# Patient Record
Sex: Male | Born: 1970 | Race: Black or African American | Hispanic: No | Marital: Married | Smoking: Never smoker
Health system: Southern US, Community
[De-identification: ages and names within clinical notes are randomized; demographics above are authoritative.]

## PROBLEM LIST (undated history)

## (undated) DIAGNOSIS — G4730 Sleep apnea, unspecified: Secondary | ICD-10-CM

## (undated) DIAGNOSIS — E785 Hyperlipidemia, unspecified: Secondary | ICD-10-CM

## (undated) DIAGNOSIS — E119 Type 2 diabetes mellitus without complications: Secondary | ICD-10-CM

## (undated) DIAGNOSIS — I10 Essential (primary) hypertension: Secondary | ICD-10-CM

## (undated) HISTORY — PX: WISDOM TOOTH EXTRACTION: SHX21

## (undated) HISTORY — DX: Hyperlipidemia, unspecified: E78.5

## (undated) HISTORY — DX: Type 2 diabetes mellitus without complications: E11.9

## (undated) HISTORY — DX: Essential (primary) hypertension: I10

---

## 2004-04-02 ENCOUNTER — Emergency Department (HOSPITAL_COMMUNITY): Admission: EM | Admit: 2004-04-02 | Discharge: 2004-04-02 | Payer: Self-pay | Admitting: Emergency Medicine

## 2004-06-11 ENCOUNTER — Emergency Department (HOSPITAL_COMMUNITY): Admission: EM | Admit: 2004-06-11 | Discharge: 2004-06-11 | Payer: Self-pay | Admitting: *Deleted

## 2015-03-11 ENCOUNTER — Emergency Department (HOSPITAL_COMMUNITY)
Admission: EM | Admit: 2015-03-11 | Discharge: 2015-03-11 | Disposition: A | Discharge status: Home / Self Care | Payer: BLUE CROSS/BLUE SHIELD | Attending: Emergency Medicine | Admitting: Emergency Medicine

## 2015-03-11 ENCOUNTER — Emergency Department (HOSPITAL_COMMUNITY): Payer: BLUE CROSS/BLUE SHIELD

## 2015-03-11 ENCOUNTER — Encounter (HOSPITAL_COMMUNITY): Payer: Self-pay | Admitting: Emergency Medicine

## 2015-03-11 DIAGNOSIS — R200 Anesthesia of skin: Secondary | ICD-10-CM | POA: Insufficient documentation

## 2015-03-11 DIAGNOSIS — N200 Calculus of kidney: Secondary | ICD-10-CM | POA: Insufficient documentation

## 2015-03-11 DIAGNOSIS — R1012 Left upper quadrant pain: Secondary | ICD-10-CM | POA: Diagnosis present

## 2015-03-11 DIAGNOSIS — R109 Unspecified abdominal pain: Secondary | ICD-10-CM

## 2015-03-11 DIAGNOSIS — Z880 Allergy status to penicillin: Secondary | ICD-10-CM | POA: Insufficient documentation

## 2015-03-11 LAB — URINALYSIS, ROUTINE W REFLEX MICROSCOPIC
Bilirubin Urine: NEGATIVE
Glucose, UA: 100 mg/dL — AB
Ketones, ur: 15 mg/dL — AB
Leukocytes, UA: NEGATIVE
Nitrite: NEGATIVE
Protein, ur: NEGATIVE mg/dL
Specific Gravity, Urine: 1.02 (ref 1.005–1.030)
Urobilinogen, UA: 0.2 mg/dL (ref 0.0–1.0)
pH: 6 (ref 5.0–8.0)

## 2015-03-11 LAB — CBC WITH DIFFERENTIAL/PLATELET
Basophils Absolute: 0 10*3/uL (ref 0.0–0.1)
Basophils Relative: 0 % (ref 0–1)
Eosinophils Absolute: 0.1 10*3/uL (ref 0.0–0.7)
Eosinophils Relative: 1 % (ref 0–5)
HCT: 42.6 % (ref 39.0–52.0)
Hemoglobin: 14.7 g/dL (ref 13.0–17.0)
Lymphocytes Relative: 11 % — ABNORMAL LOW (ref 12–46)
Lymphs Abs: 1.3 10*3/uL (ref 0.7–4.0)
MCH: 31.1 pg (ref 26.0–34.0)
MCHC: 34.5 g/dL (ref 30.0–36.0)
MCV: 90.1 fL (ref 78.0–100.0)
Monocytes Absolute: 0.5 10*3/uL (ref 0.1–1.0)
Monocytes Relative: 4 % (ref 3–12)
Neutro Abs: 10.3 10*3/uL — ABNORMAL HIGH (ref 1.7–7.7)
Neutrophils Relative %: 84 % — ABNORMAL HIGH (ref 43–77)
Platelets: 258 10*3/uL (ref 150–400)
RBC: 4.73 MIL/uL (ref 4.22–5.81)
RDW: 14.3 % (ref 11.5–15.5)
WBC: 12.1 10*3/uL — ABNORMAL HIGH (ref 4.0–10.5)

## 2015-03-11 LAB — COMPREHENSIVE METABOLIC PANEL
ALT: 37 U/L (ref 17–63)
AST: 34 U/L (ref 15–41)
Albumin: 3.8 g/dL (ref 3.5–5.0)
Alkaline Phosphatase: 98 U/L (ref 38–126)
Anion gap: 11 (ref 5–15)
BUN: 8 mg/dL (ref 6–20)
CO2: 23 mmol/L (ref 22–32)
Calcium: 9.4 mg/dL (ref 8.9–10.3)
Chloride: 106 mmol/L (ref 101–111)
Creatinine, Ser: 1.36 mg/dL — ABNORMAL HIGH (ref 0.61–1.24)
GFR calc Af Amer: >60 mL/min (ref >60)
GFR calc non Af Amer: >60 mL/min (ref >60)
Glucose, Bld: 179 mg/dL — ABNORMAL HIGH (ref 65–99)
Potassium: 4.2 mmol/L (ref 3.5–5.1)
Sodium: 140 mmol/L (ref 135–145)
Total Bilirubin: 0.9 mg/dL (ref 0.3–1.2)
Total Protein: 7.3 g/dL (ref 6.5–8.1)

## 2015-03-11 LAB — URINE MICROSCOPIC-ADD ON

## 2015-03-11 LAB — LIPASE, BLOOD: Lipase: 24 U/L (ref 22–51)

## 2015-03-11 MED ORDER — KETOROLAC TROMETHAMINE 30 MG/ML IJ SOLN
30.0000 mg | Freq: Once | INTRAMUSCULAR | Status: AC
  Administered 2015-03-11: 30 mg via INTRAVENOUS
  Filled 2015-03-11: qty 1

## 2015-03-11 MED ORDER — HYDROCODONE-ACETAMINOPHEN 5-325 MG PO TABS: 1.0000 | ORAL_TABLET | Freq: Four times a day (QID) | ORAL | Status: DC | PRN

## 2015-03-11 MED ORDER — HYDROMORPHONE HCL 1 MG/ML IJ SOLN
1.0000 mg | Freq: Once | INTRAMUSCULAR | Status: DC
  Filled 2015-03-11: qty 1

## 2015-03-11 MED ORDER — SODIUM CHLORIDE 0.9 % IV BOLUS (SEPSIS)
1000.0000 mL | Freq: Once | INTRAVENOUS | Status: AC
  Administered 2015-03-11: 1000 mL via INTRAVENOUS

## 2015-03-11 MED ORDER — ONDANSETRON HCL 4 MG/2ML IJ SOLN
4.0000 mg | Freq: Once | INTRAMUSCULAR | Status: AC
  Administered 2015-03-11: 4 mg via INTRAVENOUS
  Filled 2015-03-11: qty 2

## 2015-03-11 MED ORDER — HYDROMORPHONE HCL 1 MG/ML IJ SOLN
1.0000 mg | Freq: Once | INTRAMUSCULAR | Status: AC
  Administered 2015-03-11: 1 mg via INTRAVENOUS
  Filled 2015-03-11: qty 1

## 2015-03-11 NOTE — ED Notes (Signed)
Pt.'s wife stated, he called me and said he was having severe stomach and side pain, this was at 1300.  Pt. Got out of chair and laid in the floor at triage. Pt. Is thrashing back and forth.

## 2015-03-11 NOTE — ED Provider Notes (Signed)
CSN: [id number]     Arrival date & time 03/11/15  1444 History   First MD Initiated Contact with Patient 03/11/15 1508     Chief Complaint  Patient presents with  . Abdominal Pain     (Consider location/radiation/quality/duration/timing/severity/associated sxs/prior Treatment) HPI Pt is a previously healthy 44 yo male presenting with acute onset of left flank pain that began approx 2 hours ago.  Associated with left leg numbness and mild radiation to the groin.  Nubmness has subsided and denies any injury with turning, fall, trauma, or injury.  Denies dysuria, hematuria, melena, or abdominal pain prior to event.  Pain associated with NBNB  n/v and no diarrhea.  Over past several days wife and patient note no abnormal events, fevers, chills, or illness otherwise.    History reviewed. No pertinent past medical history. History reviewed. No pertinent past surgical history. No family history on file. History  Substance Use Topics  . Smoking status: Never Smoker   . Smokeless tobacco: Not on file  . Alcohol Use: No    Review of Systems  Constitutional: Negative for fever and chills.  HENT: Negative for congestion and sore throat.   Eyes: Negative for pain.  Respiratory: Negative for cough and shortness of breath.   Cardiovascular: Negative for chest pain and palpitations.  Gastrointestinal: Negative for nausea, vomiting, abdominal pain and diarrhea.  Endocrine: Negative.   Genitourinary: Negative for dysuria, hematuria, flank pain, discharge, penile pain and testicular pain.  Musculoskeletal: Positive for back pain (left back and flank). Negative for neck pain.  Skin: Negative for rash.  Allergic/Immunologic: Negative.   Neurological: Positive for numbness. Negative for dizziness, syncope and light-headedness.  Psychiatric/Behavioral: Negative for confusion.   Allergies  Penicillins  Home Medications   Prior to Admission medications   Medication Sig Start Date End Date Taking?  Authorizing Provider  calcium carbonate (TUMS - DOSED IN MG ELEMENTAL CALCIUM) 500 MG chewable tablet Chew 2 tablets by mouth daily as needed for indigestion or heartburn.   Yes Historical Provider, MD  ibuprofen (ADVIL,MOTRIN) 200 MG tablet Take 200 mg by mouth every 6 (six) hours as needed for headache, mild pain or moderate pain.   Yes Historical Provider, MD  HYDROcodone-acetaminophen (NORCO) 5-325 MG per tablet Take 1 tablet by mouth every 6 (six) hours as needed for moderate pain. 03/11/15   Geronimo Boot, MD   BP 118/77 mmHg  Pulse 80  Temp(Src) 98.1 F (36.7 C) (Oral)  Resp 20  Ht 5\' 9"  (1.753 m)  Wt 225 lb (102.059 kg)  BMI 33.21 kg/m2  SpO2 98% Physical Exam  Constitutional: He is oriented to person, place, and time. He appears well-developed and well-nourished. He appears distressed.  Pt rolling in bed in pain.   HENT:  Head: Normocephalic and atraumatic.  Eyes: Conjunctivae and EOM are normal. Pupils are equal, round, and reactive to light.  Neck: Normal range of motion. Neck supple.  Cardiovascular: Normal rate, regular rhythm, normal heart sounds and intact distal pulses.   Pulmonary/Chest: Effort normal and breath sounds normal. No respiratory distress.  Abdominal: Soft. Bowel sounds are normal. There is tenderness in the left upper quadrant. There is CVA tenderness (left). There is no rigidity, no rebound, no guarding, no tenderness at McBurney's point and negative Murphy's sign.    Musculoskeletal: Normal range of motion.  Neurological: He is alert and oriented to person, place, and time. He has normal strength and normal reflexes. No cranial nerve deficit or sensory deficit. GCS eye subscore  is 4. GCS verbal subscore is 5. GCS motor subscore is 6.  Gait normal and standing on his own.  Skin: Skin is warm and dry.    ED Course  Procedures (including critical care time) Labs Review Labs Reviewed  COMPREHENSIVE METABOLIC PANEL - Abnormal; Notable for the following:     Glucose, Bld 179 (*)    Creatinine, Ser 1.36 (*)    All other components within normal limits  URINALYSIS, ROUTINE W REFLEX MICROSCOPIC (NOT AT [facility]) - Abnormal; Notable for the following:    Glucose, UA 100 (*)    Hgb urine dipstick LARGE (*)    Ketones, ur 15 (*)    All other components within normal limits  CBC WITH DIFFERENTIAL/PLATELET - Abnormal; Notable for the following:    WBC 12.1 (*)    Neutrophils Relative % 84 (*)    Neutro Abs 10.3 (*)    Lymphocytes Relative 11 (*)    All other components within normal limits  URINE MICROSCOPIC-ADD ON - Abnormal; Notable for the following:    Casts HYALINE CASTS (*)    All other components within normal limits  LIPASE, BLOOD    Imaging Review Ct Renal Stone Study  03/11/2015   CLINICAL DATA:  Left flank pain.  EXAM: CT ABDOMEN AND PELVIS WITHOUT CONTRAST  TECHNIQUE: Multidetector CT imaging of the abdomen and pelvis was performed following the standard protocol without IV contrast.  COMPARISON:  None.  FINDINGS: Mild scarring or atelectasis is present in the lung bases. There is no pleural effusion.  The liver, gallbladder, spleen, adrenal glands, and pancreas have an unremarkable unenhanced appearance. There is a punctate nonobstructing calculus in the lower pole the right kidney, and there are 1-2 punctate nonobstructing calculi in the lower pole of the left kidney. There is a 5 mm calculus in the distal left ureter near the UVJ. There is minimal left hydroureteronephrosis, and there is mild left perinephric stranding.  There is no evidence of bowel obstruction. Appendix is unremarkable. There is a 1.3 cm rounded focus of slightly increased attenuation in the fat between the sigmoid colon and left external iliac vessels, nonspecific but could reflect a focus of remote inflammation such as from prior epiploic appendagitis.  Bladder is unremarkable. A circumaortic left renal vein is incidentally noted. Minimal atherosclerotic vascular  calcification is present. No free fluid or enlarged lymph nodes are identified. There is mild disc space narrowing and vacuum disc phenomenon at L4-5.  IMPRESSION: 1. 5 mm distal left ureteral calculus with minimal hydroureteronephrosis. 2. Punctate nonobstructing renal calculi bilaterally.   Electronically Signed   By: Logan Bores   On: 03/11/2015 16:09     EKG Interpretation   Date/Time:  Friday March 11 2015 15:24:41 EDT Ventricular Rate:  73 PR Interval:  160 QRS Duration: 95 QT Interval:  385 QTC Calculation: 424 R Axis:   45 Text Interpretation:  Sinus rhythm ST elev, probable normal early repol  pattern agree Confirmed by Johnney Killian, MD, Jeannie Done [id number]) on 03/11/2015  3:37:50 PM      MDM   Final diagnoses:  Left flank pain  Nephrolithiasis   Pt is a previously healthy 44 yo male presenting with acute onset of severe left flank pain that began approx 2 hours ago.  On evaluation patient was hemodynamically stable and in distress rolling around in the bed. Patient given Dilaudid and Toradol for pain relief from 10/10 to 3/10.    DDX nephrolithiasis, dissection, ACS, herniated disc, musculoskeletal strain, pyelonephritis  WBC 12.1, Hgb 14.7, Platelets 258, Lipase 24  Pt with no hx of HTN and not hypertensive on exam.  Pt reports no CP or SOB.  EKG with early mild ST elevation of precordial leads consistent with repol.  Equal femoral, radial, and DP pulses.  Doubt dissection.  CT stone study displayed 5 mm stone in distal ureter.  Urine with no signs of infection.  Feel stone likely has passed or in later process of doing so.  Pt reported no continued pain after Toradol given in the ED and requesting to return home. Educated pt on the course of kidney stones and given red flag return precautions.   If performed, labs, EKGs, and imaging were reviewed/interpreted by myself and my attending and incorporated into medical decision making.  Discussed pertinent finding with patient or  caregiver prior to discharge with no further questions.  Immediate return precautions given and pt or caregiver reports understanding.  Pt care supervised by my attending Dr. Johnney Killian.   Geronimo Boot, MD PGY-2  Emergency Medicine      Geronimo Boot, MD 03/12/15 [employer], MD 03/20/15 [phone]

## 2015-03-11 NOTE — Discharge Instructions (Signed)

## 2018-08-27 DIAGNOSIS — Z6841 Body Mass Index (BMI) 40.0 and over, adult: Secondary | ICD-10-CM | POA: Diagnosis not present

## 2018-08-27 DIAGNOSIS — Z0001 Encounter for general adult medical examination with abnormal findings: Secondary | ICD-10-CM | POA: Diagnosis not present

## 2018-08-27 DIAGNOSIS — Z0000 Encounter for general adult medical examination without abnormal findings: Secondary | ICD-10-CM | POA: Diagnosis not present

## 2018-11-26 DIAGNOSIS — E119 Type 2 diabetes mellitus without complications: Secondary | ICD-10-CM | POA: Diagnosis not present

## 2018-11-26 DIAGNOSIS — Z6841 Body Mass Index (BMI) 40.0 and over, adult: Secondary | ICD-10-CM | POA: Diagnosis not present

## 2018-11-26 DIAGNOSIS — I10 Essential (primary) hypertension: Secondary | ICD-10-CM | POA: Diagnosis not present

## 2019-02-10 DIAGNOSIS — G479 Sleep disorder, unspecified: Secondary | ICD-10-CM | POA: Diagnosis not present

## 2019-02-10 DIAGNOSIS — Z6841 Body Mass Index (BMI) 40.0 and over, adult: Secondary | ICD-10-CM | POA: Diagnosis not present

## 2019-02-10 DIAGNOSIS — E119 Type 2 diabetes mellitus without complications: Secondary | ICD-10-CM | POA: Diagnosis not present

## 2019-02-10 DIAGNOSIS — I10 Essential (primary) hypertension: Secondary | ICD-10-CM | POA: Diagnosis not present

## 2019-02-25 DIAGNOSIS — E119 Type 2 diabetes mellitus without complications: Secondary | ICD-10-CM | POA: Diagnosis not present

## 2019-02-25 DIAGNOSIS — Z6841 Body Mass Index (BMI) 40.0 and over, adult: Secondary | ICD-10-CM | POA: Diagnosis not present

## 2019-02-25 DIAGNOSIS — R739 Hyperglycemia, unspecified: Secondary | ICD-10-CM | POA: Diagnosis not present

## 2019-02-26 DIAGNOSIS — E1165 Type 2 diabetes mellitus with hyperglycemia: Secondary | ICD-10-CM | POA: Diagnosis not present

## 2019-02-26 DIAGNOSIS — E785 Hyperlipidemia, unspecified: Secondary | ICD-10-CM | POA: Diagnosis not present

## 2019-02-26 DIAGNOSIS — I10 Essential (primary) hypertension: Secondary | ICD-10-CM | POA: Diagnosis not present

## 2019-05-28 DIAGNOSIS — I10 Essential (primary) hypertension: Secondary | ICD-10-CM | POA: Diagnosis not present

## 2019-05-28 DIAGNOSIS — G479 Sleep disorder, unspecified: Secondary | ICD-10-CM | POA: Diagnosis not present

## 2019-05-28 DIAGNOSIS — E119 Type 2 diabetes mellitus without complications: Secondary | ICD-10-CM | POA: Diagnosis not present

## 2019-06-16 DIAGNOSIS — I10 Essential (primary) hypertension: Secondary | ICD-10-CM | POA: Diagnosis not present

## 2019-06-16 DIAGNOSIS — R5383 Other fatigue: Secondary | ICD-10-CM | POA: Diagnosis not present

## 2019-06-16 DIAGNOSIS — G4700 Insomnia, unspecified: Secondary | ICD-10-CM | POA: Diagnosis not present

## 2019-10-31 ENCOUNTER — Ambulatory Visit: Payer: Self-pay | Attending: Internal Medicine

## 2019-10-31 DIAGNOSIS — Z23 Encounter for immunization: Secondary | ICD-10-CM

## 2019-10-31 NOTE — Progress Notes (Signed)
   Covid-19 Vaccination Clinic  Name:  Jonathan Sellers    MRN: [id number] DOB: [birth year]  10/31/2019  [name] was observed post Covid-19 immunization for 15 minutes without incident. He was provided with Vaccine Information Sheet and instruction to access the V-Safe system.   [name] was instructed to call 911 with any severe reactions post vaccine: Marland Kitchen Difficulty breathing  . Swelling of face and throat  . A fast heartbeat  . A bad rash all over body  . Dizziness and weakness   Immunizations Administered    Name Date Dose VIS Date Route   Moderna COVID-19 Vaccine 10/31/2019 10:07 AM 0.5 mL 07/21/2019 Intramuscular   Manufacturer: Moderna   Lot: [id number]   [id number]

## 2019-11-16 ENCOUNTER — Encounter: Payer: Self-pay | Admitting: *Deleted

## 2019-12-02 ENCOUNTER — Ambulatory Visit: Payer: Self-pay | Attending: Internal Medicine

## 2019-12-02 DIAGNOSIS — Z23 Encounter for immunization: Secondary | ICD-10-CM

## 2019-12-02 NOTE — Progress Notes (Signed)
   Covid-19 Vaccination Clinic  Name:  Jonathan Sellers    MRN: [id number] DOB: [birth year]  12/02/2019  [name] was observed post Covid-19 immunization for 15 minutes without incident. He was provided with Vaccine Information Sheet and instruction to access the V-Safe system.   [name] was instructed to call 911 with any severe reactions post vaccine: Marland Kitchen Difficulty breathing  . Swelling of face and throat  . A fast heartbeat  . A bad rash all over body  . Dizziness and weakness   Immunizations Administered    Name Date Dose VIS Date Route   Moderna COVID-19 Vaccine 12/02/2019  9:28 AM 0.5 mL 07/21/2019 Intramuscular   Manufacturer: Moderna   Lot: [id number]   [place]:[id number]

## 2019-12-22 ENCOUNTER — Telehealth: Payer: Self-pay | Admitting: *Deleted

## 2019-12-22 NOTE — Telephone Encounter (Signed)
Tried to call pt to inform him that his appt needs to be rescheduled.  Unable to leave a message due to restrictions on his phone.

## 2019-12-24 ENCOUNTER — Ambulatory Visit: Payer: Self-pay

## 2020-03-21 ENCOUNTER — Ambulatory Visit (INDEPENDENT_AMBULATORY_CARE_PROVIDER_SITE_OTHER): Payer: Self-pay | Admitting: *Deleted

## 2020-03-21 ENCOUNTER — Other Ambulatory Visit: Payer: Self-pay

## 2020-03-21 VITALS — Ht 67.0 in | Wt 307.8 lb

## 2020-03-21 DIAGNOSIS — Z1211 Encounter for screening for malignant neoplasm of colon: Secondary | ICD-10-CM

## 2020-03-21 NOTE — Progress Notes (Signed)
Gastroenterology Pre-Procedure Review  Request Date: 03/21/2020 Requesting Physician: Dr. Legrand Rams, no previous TCS  PATIENT REVIEW QUESTIONS: The patient responded to the following health history questions as indicated:    1. Diabetes Melitis: yes 2. Joint replacements in the past 12 months: no 3. Major health problems in the past 3 months: no 4. Has an artificial valve or MVP: no 5. Has a defibrillator: no 6. Has been advised in past to take antibiotics in advance of a procedure like teeth cleaning: no 7. Family history of colon cancer: no  8. Alcohol Use: no 9. Illicit drug Use: no 10. History of sleep apnea: yes, borderline but doesn't use CPAP yet 11. History of coronary artery or other vascular stents placed within the last 12 months: no 12. History of any prior anesthesia complications: no 13. Body mass index is 48.21 kg/m.    MEDICATIONS & ALLERGIES:    Patient reports the following regarding taking any blood thinners:   Plavix? no Aspirin? no Coumadin? no Brilinta? no Xarelto? no Eliquis? no Pradaxa? no Savaysa? no Effient? no  Patient confirms/reports the following medications:  Current Outpatient Medications  Medication Sig Dispense Refill  . atorvastatin (LIPITOR) 20 MG tablet Take 20 mg by mouth daily.    . calcium carbonate (TUMS - DOSED IN MG ELEMENTAL CALCIUM) 500 MG chewable tablet Chew 2 tablets by mouth daily as needed for indigestion or heartburn.    Marland Kitchen ibuprofen (ADVIL,MOTRIN) 200 MG tablet Take 200 mg by mouth every 6 (six) hours as needed for headache, mild pain or moderate pain.    Marland Kitchen lisinopril-hydrochlorothiazide (ZESTORETIC) 20-12.5 MG tablet Take 1 tablet by mouth in the morning and at bedtime.    . metFORMIN (GLUCOPHAGE) 500 MG tablet Take by mouth 2 (two) times daily with a meal.     No current facility-administered medications for this visit.    Patient confirms/reports the following allergies:  Allergies  Allergen Reactions  . Penicillins  Hives    No orders of the defined types were placed in this encounter.   AUTHORIZATION INFORMATION Primary Insurance: [facility] Preferred,  [place] #:[id number] ,  Group #: [id number] Pre-Cert / Josem Kaufmann required: No, not required  SCHEDULE INFORMATION: Procedure has been scheduled as follows:  Date: , Time:  Location: [facility] with Dr. Abbey Chatters  This Gastroenterology Pre-Precedure Review Form is being routed to the following provider(s): Roseanne Kaufman, NP

## 2020-03-23 NOTE — Progress Notes (Signed)
ASA III, which would mean office visit.

## 2020-03-24 ENCOUNTER — Telehealth: Payer: Self-pay | Admitting: *Deleted

## 2020-03-24 NOTE — Telephone Encounter (Signed)
Left a message for pt to call me back. Pt will need ov per Roseanne Kaufman, NP.

## 2020-03-28 ENCOUNTER — Telehealth: Payer: Self-pay | Admitting: *Deleted

## 2020-03-28 NOTE — Telephone Encounter (Signed)
Left another message for pt to call me back.

## 2020-03-28 NOTE — Telephone Encounter (Signed)
Tried to call pt back but had to leave a voice mail.

## 2020-03-28 NOTE — Telephone Encounter (Signed)
Tried to call pt again but had to leave a message for a call back.

## 2020-03-28 NOTE — Telephone Encounter (Signed)
Patient called asking to speak with Christ Kick. [phone]

## 2020-03-29 NOTE — Progress Notes (Addendum)
Spoke to pt and informed him that he would need ov before scheduling procedure per our provider Roseanne Kaufman, NP).  Scheduled ov for 05/26/2020 at 10:30 with Aliene Altes, PA.

## 2020-03-29 NOTE — Telephone Encounter (Signed)
Spoke to pt and informed him that he would need ov before scheduling procedure per our provider Roseanne Kaufman, NP).  Scheduled ov for 05/26/2020 at 10:30 with Aliene Altes, PA.

## 2020-05-25 NOTE — Progress Notes (Signed)
Referring Provider: Dr. Legrand Rams Primary Care Physician:  Rosita Fire, MD Primary Gastroenterologist:  Dr. Abbey Chatters  Chief Complaint  Patient presents with  . Consult    TCS never done prior    HPI:   Jonathan Sellers is a 49 y.o. male presenting today at the request of Dr. Legrand Rams for colon cancer screening.  Patient was initially a nurse triage visit.  Due to BMI, he meets ASA 3 category which requires office visit to schedule.  Today: No prior colonoscopy.  No abdominal pain. BMs daily. No constiaption or diarrhea. No blood in the stool. No black stool.  No unintentional weight loss.  No family history of colon cancer.  No nausea, vomiting, GERD symptoms, or dysphagia.   Advil about twice a week for back pain.   Had a sleep study and was told he has mild sleep apnea.he has not received a CPAP as he had changed insurance and is waiting to hear back.     Denies fever, chills, cold or flulike symptoms, presyncope, syncope, chest pain, heart palpitations.  Admits to SOB with exertion.  Also snores.  No regular cough. Little swelling in the lower extremities.  He is going to start working on weight loss.  He is cutting back on sugar and salt intake.  States his PCP told him he has till December to work on this.  Considering nutritionist referral if he does not make any significant improvement over the next couple of months.  Past Medical History:  Diagnosis Date  . Diabetes ([place])   . HLD (hyperlipidemia)   . HTN (hypertension)     History reviewed. No pertinent surgical history.  Current Outpatient Medications  Medication Sig Dispense Refill  . atorvastatin (LIPITOR) 20 MG tablet Take 20 mg by mouth daily.    . calcium carbonate (TUMS - DOSED IN MG ELEMENTAL CALCIUM) 500 MG chewable tablet Chew 2 tablets by mouth daily as needed for indigestion or heartburn.    Marland Kitchen ibuprofen (ADVIL,MOTRIN) 200 MG tablet Take 200 mg by mouth every 6 (six) hours as needed for headache, mild pain or  moderate pain.    Marland Kitchen lisinopril-hydrochlorothiazide (ZESTORETIC) 20-12.5 MG tablet Take 1 tablet by mouth in the morning and at bedtime.    . metFORMIN (GLUCOPHAGE) 500 MG tablet Take by mouth 2 (two) times daily with a meal.     No current facility-administered medications for this visit.    Allergies as of 05/26/2020 - Review Complete 05/26/2020  Allergen Reaction Noted  . Penicillins Hives 03/11/2015    Family History  Problem Relation Age of Onset  . Colon cancer Neg Hx     Social History   Socioeconomic History  . Marital status: Single    Spouse name: Not on file  . Number of children: Not on file  . Years of education: Not on file  . Highest education level: Not on file  Occupational History  . Not on file  Tobacco Use  . Smoking status: Never Smoker  . Smokeless tobacco: Never Used  Substance and Sexual Activity  . Alcohol use: No  . Drug use: No  . Sexual activity: Not on file  Other Topics Concern  . Not on file  Social History Narrative  . Not on file   Social Determinants of Health   Financial Resource Strain:   . Difficulty of Paying Living Expenses: Not on file  Food Insecurity:   . Worried About Charity fundraiser in the Last Year: Not  on file  . Ran Out of Food in the Last Year: Not on file  Transportation Needs:   . Lack of Transportation (Medical): Not on file  . Lack of Transportation (Non-Medical): Not on file  Physical Activity:   . Days of Exercise per Week: Not on file  . Minutes of Exercise per Session: Not on file  Stress:   . Feeling of Stress : Not on file  Social Connections:   . Frequency of Communication with Friends and Family: Not on file  . Frequency of Social Gatherings with Friends and Family: Not on file  . Attends Religious Services: Not on file  . Active Member of Clubs or Organizations: Not on file  . Attends Archivist Meetings: Not on file  . Marital Status: Not on file  Intimate Partner Violence:   .  Fear of Current or Ex-Partner: Not on file  . Emotionally Abused: Not on file  . Physically Abused: Not on file  . Sexually Abused: Not on file    Review of Systems: Gen: See HPI CV: See HPI.  Resp: See HPI  GI: See HPI GU : Denies urinary burning, urinary frequency, urinary hesitancy MS: Chronic intermittent lower back pain.  Derm: Denies rash Psych: Denies depression or anxiety Heme: See HPI  Physical Exam: BP 132/79   Pulse 98   Temp (!) 97.1 F (36.2 C) (Oral)   Ht 5\' 7"  (1.702 m)   Wt (!) 315 lb 6.4 oz (143.1 kg)   BMI 49.40 kg/m  General:   Alert and oriented. Pleasant and cooperative. Well-nourished and well-developed.  Head:  Normocephalic and atraumatic. Eyes:  Without icterus, sclera clear and conjunctiva pink.  Ears:  Normal auditory acuity. Lungs:  Clear to auscultation bilaterally. No wheezes, rales, or rhonchi. No distress.  Heart:  S1, S2 present without murmurs appreciated.  Abdomen:  +BS, soft, non-tender and non-distended. No HSM noted. No guarding or rebound. No masses appreciated.  Rectal:  Deferred  Msk:  Symmetrical without gross deformities. Normal posture. Extremities:  Without edema. Neurologic:  Alert and  oriented x4;  grossly normal neurologically. Skin:  Intact without significant lesions or rashes. Psych: Normal mood and affect.

## 2020-05-26 ENCOUNTER — Telehealth: Payer: Self-pay | Admitting: *Deleted

## 2020-05-26 ENCOUNTER — Encounter: Payer: Self-pay | Admitting: Gastroenterology

## 2020-05-26 ENCOUNTER — Ambulatory Visit (INDEPENDENT_AMBULATORY_CARE_PROVIDER_SITE_OTHER): Payer: No Typology Code available for payment source | Admitting: Gastroenterology

## 2020-05-26 ENCOUNTER — Other Ambulatory Visit: Payer: Self-pay

## 2020-05-26 ENCOUNTER — Encounter: Payer: Self-pay | Admitting: *Deleted

## 2020-05-26 DIAGNOSIS — Z1211 Encounter for screening for malignant neoplasm of colon: Secondary | ICD-10-CM

## 2020-05-26 NOTE — Patient Instructions (Addendum)
We will get you scheduled for a colonosocpy with Dr. Abbey Chatters in the near future.  1 day prior to your procedure: Take 1/2 dose of metformin (250 mg) in the morning and evening.  Day of your procedure: Do not take any diabetes medications the morning of your procedure.   To help with weight loss and tracking what you are eating, I recommend you try using the My fitness pal app.  We will see you back as Dr. Abbey Chatters recommends. If you have any new GI concerns, do not hesitate to call.   It was great meeting you today!   Aliene Altes, PA-C [facility] Gastroenterology

## 2020-05-26 NOTE — Assessment & Plan Note (Addendum)
49 year old male with history of HTN, HLD, diabetes, morbid obesity, and untreated sleep apnea presenting today to schedule first ever screening colonoscopy.  He has no significant upper or lower GI symptoms.  No alarm symptoms.  No family history of colon cancer.  We will proceed with colonoscopy with propofol with Dr. Abbey Chatters in the near future.The risks, benefits, and alternatives have been discussed with the patient in detail. The patient states understanding and desires to proceed.  ASA III See separate instructions for diabetes medication adjustments. Follow-up as Dr. Abbey Chatters recommends.  Advised to call if he develops any concerning GI symptoms.

## 2020-05-26 NOTE — Telephone Encounter (Signed)
Called pt. He has been scheduled for TCS with Dr. Abbey Chatters, asa 3, on 11/23 at 8:00am. Pt aware will mail instructions with pre-op/covid test appt. Confirmed address.

## 2020-07-05 NOTE — Patient Instructions (Signed)
Jonathan Sellers  07/05/2020     @PREFPERIOPPHARMACY @   Your procedure is scheduled on  07/12/2020.  Report to [facility] at  [phone]  A.M.  Call this number if you have problems the morning of surgery:  [phone]   Remember:  Follow the diet and prep instructions given to you by the office.                        Take these medicines the morning of surgery with A SIP OF WATER  None. DO NOT take any medications for diabetes the morning of your procedure.    Do not wear jewelry, make-up or nail polish.  Do not wear lotions, powders, or perfumes. Please wear deodorant and brush your teeth  Do not shave 48 hours prior to surgery.  Men may shave face and neck.  Do not bring valuables to the hospital.  [facility] is not responsible for any belongings or valuables.  Contacts, dentures or bridgework may not be worn into surgery.  Leave your suitcase in the car.  After surgery it may be brought to your room.  For patients admitted to the hospital, discharge time will be determined by your treatment team.  Patients discharged the day of surgery will not be allowed to drive home.   Name and phone number of your driver:   family Special instructions:   DO NOT smoke the morning of your procedure.  Please read over the following fact sheets that you were given. Anesthesia Post-op Instructions and Care and Recovery After Surgery       Colonoscopy, Adult, Care After This sheet gives you information about how to care for yourself after your procedure. Your health care provider may also give you more specific instructions. If you have problems or questions, contact your health care provider. What can I expect after the procedure? After the procedure, it is common to have:  A small amount of blood in your stool for 24 hours after the procedure.  Some gas.  Mild cramping or bloating of your abdomen. Follow these instructions at home: Eating and drinking   Drink enough fluid  to keep your urine pale yellow.  Follow instructions from your health care provider about eating or drinking restrictions.  Resume your normal diet as instructed by your health care provider. Avoid heavy or fried foods that are hard to digest. Activity  Rest as told by your health care provider.  Avoid sitting for a long time without moving. Get up to take short walks every 1-2 hours. This is important to improve blood flow and breathing. Ask for help if you feel weak or unsteady.  Return to your normal activities as told by your health care provider. Ask your health care provider what activities are safe for you. Managing cramping and bloating   Try walking around when you have cramps or feel bloated.  Apply heat to your abdomen as told by your health care provider. Use the heat source that your health care provider recommends, such as a moist heat pack or a heating pad. ? Place a towel between your skin and the heat source. ? Leave the heat on for 20-30 minutes. ? Remove the heat if your skin turns bright red. This is especially important if you are unable to feel pain, heat, or cold. You may have a greater risk of getting burned. General instructions  For the first 24 hours  after the procedure: ? Do not drive or use machinery. ? Do not sign important documents. ? Do not drink alcohol. ? Do your regular daily activities at a slower pace than normal. ? Eat soft foods that are easy to digest.  Take over-the-counter and prescription medicines only as told by your health care provider.  Keep all follow-up visits as told by your health care provider. This is important. Contact a health care provider if:  You have blood in your stool 2-3 days after the procedure. Get help right away if you have:  More than a small spotting of blood in your stool.  Large blood clots in your stool.  Swelling of your abdomen.  Nausea or vomiting.  A fever.  Increasing pain in your abdomen  that is not relieved with medicine. Summary  After the procedure, it is common to have a small amount of blood in your stool. You may also have mild cramping and bloating of your abdomen.  For the first 24 hours after the procedure, do not drive or use machinery, sign important documents, or drink alcohol.  Get help right away if you have a lot of blood in your stool, nausea or vomiting, a fever, or increased pain in your abdomen. This information is not intended to replace advice given to you by your health care provider. Make sure you discuss any questions you have with your health care provider. Document Revised: 03/02/2019 Document Reviewed: 03/02/2019 Elsevier Patient Education  [place] After These instructions provide you with information about caring for yourself after your procedure. Your health care provider may also give you more specific instructions. Your treatment has been planned according to current medical practices, but problems sometimes occur. Call your health care provider if you have any problems or questions after your procedure. What can I expect after the procedure? After your procedure, you may:  Feel sleepy for several hours.  Feel clumsy and have poor balance for several hours.  Feel forgetful about what happened after the procedure.  Have poor judgment for several hours.  Feel nauseous or vomit.  Have a sore throat if you had a breathing tube during the procedure. Follow these instructions at home: For at least 24 hours after the procedure:      Have a responsible adult stay with you. It is important to have someone help care for you until you are awake and alert.  Rest as needed.  Do not: ? Participate in activities in which you could fall or become injured. ? Drive. ? Use heavy machinery. ? Drink alcohol. ? Take sleeping pills or medicines that cause drowsiness. ? Make important decisions or sign  legal documents. ? Take care of children on your own. Eating and drinking  Follow the diet that is recommended by your health care provider.  If you vomit, drink water, juice, or soup when you can drink without vomiting.  Make sure you have little or no nausea before eating solid foods. General instructions  Take over-the-counter and prescription medicines only as told by your health care provider.  If you have sleep apnea, surgery and certain medicines can increase your risk for breathing problems. Follow instructions from your health care provider about wearing your sleep device: ? Anytime you are sleeping, including during daytime naps. ? While taking prescription pain medicines, sleeping medicines, or medicines that make you drowsy.  If you smoke, do not smoke without supervision.  Keep all follow-up visits as told  by your health care provider. This is important. Contact a health care provider if:  You keep feeling nauseous or you keep vomiting.  You feel light-headed.  You develop a rash.  You have a fever. Get help right away if:  You have trouble breathing. Summary  For several hours after your procedure, you may feel sleepy and have poor judgment.  Have a responsible adult stay with you for at least 24 hours or until you are awake and alert. This information is not intended to replace advice given to you by your health care provider. Make sure you discuss any questions you have with your health care provider. Document Revised: 11/04/2017 Document Reviewed: 11/27/2015 Elsevier Patient Education  [place].

## 2020-07-07 ENCOUNTER — Other Ambulatory Visit: Payer: Self-pay

## 2020-07-07 ENCOUNTER — Encounter (HOSPITAL_COMMUNITY)
Admission: RE | Admit: 2020-07-07 | Discharge: 2020-07-07 | Disposition: A | Discharge status: Home / Self Care | Payer: No Typology Code available for payment source | Source: Ambulatory Visit | Attending: Internal Medicine | Admitting: Internal Medicine

## 2020-07-07 ENCOUNTER — Encounter (HOSPITAL_COMMUNITY): Payer: Self-pay

## 2020-07-07 DIAGNOSIS — Z01818 Encounter for other preprocedural examination: Secondary | ICD-10-CM | POA: Diagnosis not present

## 2020-07-07 HISTORY — DX: Sleep apnea, unspecified: G47.30

## 2020-07-07 LAB — BASIC METABOLIC PANEL
Anion gap: 10 (ref 5–15)
BUN: 17 mg/dL (ref 6–20)
CO2: 27 mmol/L (ref 22–32)
Calcium: 9.6 mg/dL (ref 8.9–10.3)
Chloride: 101 mmol/L (ref 98–111)
Creatinine, Ser: 0.98 mg/dL (ref 0.61–1.24)
GFR, Estimated: >60 mL/min (ref >60)
Glucose, Bld: 137 mg/dL — ABNORMAL HIGH (ref 70–99)
Potassium: 3.9 mmol/L (ref 3.5–5.1)
Sodium: 138 mmol/L (ref 135–145)

## 2020-07-11 ENCOUNTER — Other Ambulatory Visit (HOSPITAL_COMMUNITY)
Admission: RE | Admit: 2020-07-11 | Discharge: 2020-07-11 | Disposition: A | Discharge status: Home / Self Care | Payer: No Typology Code available for payment source | Source: Ambulatory Visit | Attending: Internal Medicine | Admitting: Internal Medicine

## 2020-07-11 ENCOUNTER — Other Ambulatory Visit: Payer: Self-pay

## 2020-07-11 DIAGNOSIS — Z01812 Encounter for preprocedural laboratory examination: Secondary | ICD-10-CM | POA: Insufficient documentation

## 2020-07-11 DIAGNOSIS — Z20822 Contact with and (suspected) exposure to covid-19: Secondary | ICD-10-CM | POA: Diagnosis not present

## 2020-07-11 LAB — SARS CORONAVIRUS 2 (TAT 6-24 HRS): SARS Coronavirus 2: NEGATIVE

## 2020-07-12 ENCOUNTER — Encounter (HOSPITAL_COMMUNITY)
Admission: RE | Disposition: A | Discharge status: Home / Self Care | Payer: Self-pay | Source: Home / Self Care | Attending: Internal Medicine

## 2020-07-12 ENCOUNTER — Ambulatory Visit (HOSPITAL_COMMUNITY)
Admission: RE | Admit: 2020-07-12 | Discharge: 2020-07-12 | Disposition: A | Discharge status: Home / Self Care | Payer: No Typology Code available for payment source | Attending: Internal Medicine | Admitting: Internal Medicine

## 2020-07-12 ENCOUNTER — Encounter (HOSPITAL_COMMUNITY): Payer: Self-pay

## 2020-07-12 ENCOUNTER — Ambulatory Visit (HOSPITAL_COMMUNITY): Payer: No Typology Code available for payment source | Admitting: Anesthesiology

## 2020-07-12 DIAGNOSIS — Z880 Allergy status to penicillin: Secondary | ICD-10-CM | POA: Diagnosis not present

## 2020-07-12 DIAGNOSIS — G4730 Sleep apnea, unspecified: Secondary | ICD-10-CM | POA: Diagnosis not present

## 2020-07-12 DIAGNOSIS — Z1211 Encounter for screening for malignant neoplasm of colon: Secondary | ICD-10-CM | POA: Diagnosis not present

## 2020-07-12 DIAGNOSIS — D122 Benign neoplasm of ascending colon: Secondary | ICD-10-CM | POA: Insufficient documentation

## 2020-07-12 DIAGNOSIS — E119 Type 2 diabetes mellitus without complications: Secondary | ICD-10-CM | POA: Diagnosis not present

## 2020-07-12 DIAGNOSIS — K648 Other hemorrhoids: Secondary | ICD-10-CM | POA: Diagnosis not present

## 2020-07-12 DIAGNOSIS — Z79899 Other long term (current) drug therapy: Secondary | ICD-10-CM | POA: Insufficient documentation

## 2020-07-12 DIAGNOSIS — I10 Essential (primary) hypertension: Secondary | ICD-10-CM | POA: Diagnosis not present

## 2020-07-12 DIAGNOSIS — K635 Polyp of colon: Secondary | ICD-10-CM

## 2020-07-12 DIAGNOSIS — D124 Benign neoplasm of descending colon: Secondary | ICD-10-CM | POA: Insufficient documentation

## 2020-07-12 DIAGNOSIS — Z7984 Long term (current) use of oral hypoglycemic drugs: Secondary | ICD-10-CM | POA: Insufficient documentation

## 2020-07-12 HISTORY — PX: POLYPECTOMY: SHX5525

## 2020-07-12 HISTORY — PX: COLONOSCOPY WITH PROPOFOL: SHX5780

## 2020-07-12 LAB — GLUCOSE, CAPILLARY
Glucose-Capillary: 120 mg/dL — ABNORMAL HIGH (ref 70–99)
Glucose-Capillary: 114 mg/dL — ABNORMAL HIGH (ref 70–99)

## 2020-07-12 SURGERY — COLONOSCOPY WITH PROPOFOL
Anesthesia: General

## 2020-07-12 MED ORDER — CHLORHEXIDINE GLUCONATE CLOTH 2 % EX PADS: 6.0000 | MEDICATED_PAD | Freq: Once | CUTANEOUS | Status: DC

## 2020-07-12 MED ORDER — PROPOFOL 10 MG/ML IV BOLUS
INTRAVENOUS | Status: DC | PRN
  Administered 2020-07-12 (×5): 50 mg via INTRAVENOUS
  Administered 2020-07-12: 100 mg via INTRAVENOUS
  Administered 2020-07-12 (×3): 50 mg via INTRAVENOUS
  Administered 2020-07-12: 100 mg via INTRAVENOUS

## 2020-07-12 MED ORDER — LACTATED RINGERS IV SOLN
Freq: Once | INTRAVENOUS | Status: AC
  Administered 2020-07-12: 1000 mL via INTRAVENOUS

## 2020-07-12 MED ORDER — LACTATED RINGERS IV SOLN: INTRAVENOUS | Status: DC | PRN

## 2020-07-12 MED ORDER — STERILE WATER FOR IRRIGATION IR SOLN
Status: DC | PRN
  Administered 2020-07-12: 1.5 mL

## 2020-07-12 MED ORDER — PROPOFOL 10 MG/ML IV BOLUS
INTRAVENOUS | Status: AC
  Filled 2020-07-12: qty 20

## 2020-07-12 NOTE — Discharge Instructions (Addendum)
  Colonoscopy Discharge Instructions  Read the instructions outlined below and refer to this sheet in the next few weeks. These discharge instructions provide you with general information on caring for yourself after you leave the hospital. Your doctor may also give you specific instructions. While your treatment has been planned according to the most current medical practices available, unavoidable complications occasionally occur.   ACTIVITY  You may resume your regular activity, but move at a slower pace for the next 24 hours.   Take frequent rest periods for the next 24 hours.   Walking will help get rid of the air and reduce the bloated feeling in your belly (abdomen).   No driving for 24 hours (because of the medicine (anesthesia) used during the test).    Do not sign any important legal documents or operate any machinery for 24 hours (because of the anesthesia used during the test).  NUTRITION  Drink plenty of fluids.   You may resume your normal diet as instructed by your doctor.   Begin with a light meal and progress to your normal diet. Heavy or fried foods are harder to digest and may make you feel sick to your stomach (nauseated).   Avoid alcoholic beverages for 24 hours or as instructed.  MEDICATIONS  You may resume your normal medications unless your doctor tells you otherwise.  WHAT YOU CAN EXPECT TODAY  Some feelings of bloating in the abdomen.   Passage of more gas than usual.   Spotting of blood in your stool or on the toilet paper.  IF YOU HAD POLYPS REMOVED DURING THE COLONOSCOPY:  No aspirin products for 7 days or as instructed.   No alcohol for 7 days or as instructed.   Eat a soft diet for the next 24 hours.  FINDING OUT THE RESULTS OF YOUR TEST Not all test results are available during your visit. If your test results are not back during the visit, make an appointment with your caregiver to find out the results. Do not assume everything is normal if  you have not heard from your caregiver or the medical facility. It is important for you to follow up on all of your test results.  SEEK IMMEDIATE MEDICAL ATTENTION IF:  You have more than a spotting of blood in your stool.   Your belly is swollen (abdominal distention).   You are nauseated or vomiting.   You have a temperature over 101.   You have abdominal pain or discomfort that is severe or gets worse throughout the day.   Your colonoscopy revealed 2 polyps which I removed successfully.  Unfortunately the right-sided colon was not adequately prepped so I could have missed polyps.  I would recommend that we repeat this in 6 months with extended prep.  Await pathology results, my office will contact you.  Follow-up with GI after next colonoscopy.  I hope you have a great rest of your week!  Elon Alas. Abbey Chatters, D.O. Gastroenterology and Hepatology [facility]

## 2020-07-12 NOTE — Anesthesia Preprocedure Evaluation (Signed)
Anesthesia Evaluation  Patient identified by MRN, date of birth, ID band Patient awake    Reviewed: Allergy & Precautions, NPO status , Patient's Chart, lab work & pertinent test results  History of Anesthesia Complications Negative for: history of anesthetic complications  Airway Mallampati: I  TM Distance: >3 FB Neck ROM: Full    Dental  (+) Dental Advisory Given, Missing   Pulmonary sleep apnea ,    Pulmonary exam normal breath sounds clear to auscultation       Cardiovascular Exercise Tolerance: Good hypertension, Pt. on medications Normal cardiovascular exam Rhythm:Regular Rate:Normal - Systolic murmurs, - Diastolic murmurs, - Friction Rub, - Carotid Bruit, - Peripheral Edema and - Systolic Click    Neuro/Psych negative neurological ROS  negative psych ROS   GI/Hepatic negative GI ROS, Neg liver ROS, Bowel prep,  Endo/Other  diabetes, Well Controlled, Type 2, Oral Hypoglycemic AgentsMorbid obesity  Renal/GU negative Renal ROS     Musculoskeletal negative musculoskeletal ROS (+)   Abdominal   Peds negative pediatric ROS (+)  Hematology negative hematology ROS (+)   Anesthesia Other Findings   Reproductive/Obstetrics negative OB ROS                            Anesthesia Physical Anesthesia Plan  ASA: III  Anesthesia Plan: General   Post-op Pain Management:    Induction: Intravenous  PONV Risk Score and Plan: TIVA  Airway Management Planned: Nasal Cannula, Natural Airway and Simple Face Mask  Additional Equipment:   Intra-op Plan:   Post-operative Plan:   Informed Consent: I have reviewed the patients History and Physical, chart, labs and discussed the procedure including the risks, benefits and alternatives for the proposed anesthesia with the patient or authorized representative who has indicated his/her understanding and acceptance.       Plan Discussed with:  Surgeon and CRNA  Anesthesia Plan Comments:         Anesthesia Quick Evaluation

## 2020-07-12 NOTE — Op Note (Signed)
[facility] Patient Name: Jonathan Sellers Procedure Date: 07/12/2020 8:03 AM MRN: [id number] Date of Birth: 1971-04-18 Attending MD: Elon Alas. Edgar Frisk CSN: [id number] Age: 49 Admit Type: Outpatient Procedure:                Colonoscopy Indications:              Screening for colorectal malignant neoplasm Providers:                Elon Alas. Abbey Chatters, DO, Charlsie Quest. Theda Sers RN, RN,                            Lambert Mody, Randa Spike, Technician Referring MD:              Medicines:                See the Anesthesia note for documentation of the                            administered medications Complications:            No immediate complications. Estimated Blood Loss:     Estimated blood loss was minimal. Procedure:                Pre-Anesthesia Assessment:                           - The anesthesia plan was to use monitored                            anesthesia care (MAC).                           After obtaining informed consent, the colonoscope                            was passed under direct vision. Throughout the                            procedure, the patient's blood pressure, pulse, and                            oxygen saturations were monitored continuously. The                            PCF-[id number] ([id number]) was introduced through the                            anus and advanced to the the cecum, identified by                            appendiceal orifice and ileocecal valve. The                            colonoscopy was performed without difficulty. The                            patient tolerated the  procedure well. The quality                            of the bowel preparation was evaluated using the                            BBPS [facility] Bowel Preparation Scale) with scores                            of: Right Colon = 1 (portion of mucosa seen, but                            other areas not well seen due to staining, residual                             stool and/or opaque liquid), Transverse Colon = 2                            (minor amount of residual staining, small fragments                            of stool and/or opaque liquid, but mucosa seen                            well) and Left Colon = 2 (minor amount of residual                            staining, small fragments of stool and/or opaque                            liquid, but mucosa seen well). The total BBPS score                            equals 5. The quality of the bowel preparation was                            inadequate. Scope In: 8:17:17 AM Scope Out: 8:31:30 AM Scope Withdrawal Time: 0 hours 8 minutes 7 seconds  Total Procedure Duration: 0 hours 14 minutes 13 seconds  Findings:      The perianal and digital rectal examinations were normal.      Non-bleeding internal hemorrhoids were found during endoscopy.      A 5 mm polyp was found in the ascending colon. The polyp was sessile.       The polyp was removed with a cold snare. Resection and retrieval were       complete.      A 6 mm polyp was found in the descending colon. The polyp was sessile.       The polyp was removed with a cold snare. Resection and retrieval were       complete.      A large amount of stool was found in the entire colon, precluding       visualization. Lavage of the area was performed using copious amounts of  sterile water, resulting in incomplete clearance with fair visualization. Impression:               - Preparation of the colon was inadequate.                           - Non-bleeding internal hemorrhoids.                           - One 5 mm polyp in the ascending colon, removed                            with a cold snare. Resected and retrieved.                           - One 6 mm polyp in the descending colon, removed                            with a cold snare. Resected and retrieved.                           - Stool in the entire examined colon. Moderate  Sedation:      Per Anesthesia Care Recommendation:           - Patient has a contact number available for                            emergencies. The signs and symptoms of potential                            delayed complications were discussed with the                            patient. Return to normal activities tomorrow.                            Written discharge instructions were provided to the                            patient.                           - Resume previous diet.                           - Continue present medications.                           - Await pathology results.                           - Repeat colonoscopy in 6 months because the bowel                            preparation was suboptimal.                           -  Return to GI clinic PRN. Procedure Code(s):        --- Professional ---                           [phone], Colonoscopy, flexible; with removal of                            tumor(s), polyp(s), or other lesion(s) by snare                            technique Diagnosis Code(s):        --- Professional ---                           Z12.11, Encounter for screening for malignant                            neoplasm of colon                           K64.8, Other hemorrhoids                           K63.5, Polyp of colon CPT copyright 2019 American Medical Association. All rights reserved. The codes documented in this report are preliminary and upon coder review may  be revised to meet current compliance requirements. Elon Alas. Abbey Chatters, DO [place] Abbey Chatters, DO 07/12/2020 8:34:13 AM This report has been signed electronically. Number of Addenda: 0

## 2020-07-12 NOTE — Transfer of Care (Signed)
Immediate Anesthesia Transfer of Care Note  Patient: ARLAND USERY  Procedure(s) Performed: COLONOSCOPY WITH PROPOFOL (N/A ) POLYPECTOMY  Patient Location: PACU  Anesthesia Type:General  Level of Consciousness: awake, alert , oriented and patient cooperative  Airway & Oxygen Therapy: Patient Spontanous Breathing  Post-op Assessment: Report given to RN, Post -op Vital signs reviewed and stable and Patient moving all extremities  Post vital signs: Reviewed and stable  Last Vitals:  Vitals Value Taken Time  BP    Temp    Pulse 81 07/12/20 0834  Resp 14 07/12/20 0834  SpO2 94 % 07/12/20 0834  Vitals shown include unvalidated device data.  Last Pain:  Vitals:   07/12/20 0809  TempSrc:   PainSc: 0-No pain      Patients Stated Pain Goal: 8 (61/95/09 [id number])  Complications: No complications documented.

## 2020-07-12 NOTE — Anesthesia Postprocedure Evaluation (Signed)
Anesthesia Post Note  Patient: Jonathan Sellers  Procedure(s) Performed: COLONOSCOPY WITH PROPOFOL (N/A ) POLYPECTOMY  Patient location during evaluation: PACU Anesthesia Type: General Level of consciousness: oriented, awake, awake and alert and patient cooperative Pain management: pain level controlled Vital Signs Assessment: post-procedure vital signs reviewed and stable Respiratory status: spontaneous breathing, respiratory function stable and nonlabored ventilation Cardiovascular status: blood pressure returned to baseline and stable Postop Assessment: no headache and no backache Anesthetic complications: no   No complications documented.   Last Vitals:  Vitals:   07/12/20 0712  BP: (!) 135/99  Resp: 15  Temp: 36.6 C  SpO2: 97%    Last Pain:  Vitals:   07/12/20 0809  TempSrc:   PainSc: 0-No pain                 Tacy Learn

## 2020-07-12 NOTE — H&P (Signed)
Primary Care Physician:  Rosita Fire, MD Primary Gastroenterologist:  Dr. Abbey Chatters  Pre-Procedure History & Physical: HPI:  Jonathan Sellers is a 49 y.o. male is here for a colonoscopy for colon cancer screening purposes.  Patient denies any family history of colorectal cancer.  No melena or hematochezia.  No abdominal pain or unintentional weight loss.  No change in bowel habits.  Overall feels well from a GI standpoint.  Past Medical History:  Diagnosis Date  . Diabetes ([place])   . HLD (hyperlipidemia)   . HTN (hypertension)   . Sleep apnea     Past Surgical History:  Procedure Laterality Date  . WISDOM TOOTH EXTRACTION      Prior to Admission medications   Medication Sig Start Date End Date Taking? Authorizing Provider  acetaminophen (TYLENOL) 500 MG tablet Take 1,000 mg by mouth 2 (two) times daily as needed for moderate pain or headache.   Yes [provider]  atorvastatin (LIPITOR) 20 MG tablet Take 20 mg by mouth daily.   Yes [provider]  lisinopril-hydrochlorothiazide (ZESTORETIC) 20-12.5 MG tablet Take 1 tablet by mouth in the morning and at bedtime.   Yes [provider]  metFORMIN (GLUCOPHAGE-XR) 750 MG 24 hr tablet Take 750 mg by mouth 2 (two) times daily.   Yes [provider]  naproxen sodium (ALEVE) 220 MG tablet Take 660 mg by mouth daily as needed (pain).   Yes [provider]  OVER THE COUNTER MEDICATION Apply 1 application topically daily as needed (pain). CBD cream   Yes [provider]  Phenylephrine-Acetaminophen 5-325 MG CAPS Take 2 tablets by mouth daily as needed (congestion).   Yes [provider]  sodium chloride (OCEAN) 0.65 % SOLN nasal spray Place 1 spray into both nostrils as needed for congestion.   Yes [provider]    Allergies as of 05/26/2020 - Review Complete 05/26/2020  Allergen Reaction Noted  . Penicillins Hives 03/11/2015    Family History  Problem Relation Age of  Onset  . Colon cancer Neg Hx     Social History   Socioeconomic History  . Marital status: Married    Spouse name: Not on file  . Number of children: Not on file  . Years of education: Not on file  . Highest education level: Not on file  Occupational History  . Not on file  Tobacco Use  . Smoking status: Never Smoker  . Smokeless tobacco: Never Used  Substance and Sexual Activity  . Alcohol use: No  . Drug use: No  . Sexual activity: Not on file  Other Topics Concern  . Not on file  Social History Narrative  . Not on file   Social Determinants of Health   Financial Resource Strain:   . Difficulty of Paying Living Expenses: Not on file  Food Insecurity:   . Worried About Charity fundraiser in the Last Year: Not on file  . Ran Out of Food in the Last Year: Not on file  Transportation Needs:   . Lack of Transportation (Medical): Not on file  . Lack of Transportation (Non-Medical): Not on file  Physical Activity:   . Days of Exercise per Week: Not on file  . Minutes of Exercise per Session: Not on file  Stress:   . Feeling of Stress : Not on file  Social Connections:   . Frequency of Communication with Friends and Family: Not on file  . Frequency of Social Gatherings with Friends and  Family: Not on file  . Attends Religious Services: Not on file  . Active Member of Clubs or Organizations: Not on file  . Attends Archivist Meetings: Not on file  . Marital Status: Not on file  Intimate Partner Violence:   . Fear of Current or Ex-Partner: Not on file  . Emotionally Abused: Not on file  . Physically Abused: Not on file  . Sexually Abused: Not on file    Review of Systems: See HPI, otherwise negative ROS  Impression/Plan: Jonathan Sellers is here for a colonoscopy to be performed for colon cancer screening purposes.  The risks of the procedure including infection, bleed, or perforation as well as benefits, limitations, alternatives and imponderables have  been reviewed with the patient. Questions have been answered. All parties agreeable.

## 2020-07-13 LAB — SURGICAL PATHOLOGY

## 2020-07-19 ENCOUNTER — Encounter (HOSPITAL_COMMUNITY): Payer: Self-pay | Admitting: Internal Medicine

## 2020-11-08 ENCOUNTER — Encounter: Payer: Self-pay | Admitting: Internal Medicine

## 2022-01-09 ENCOUNTER — Other Ambulatory Visit: Payer: Self-pay | Admitting: Gerontology

## 2022-01-09 DIAGNOSIS — E1159 Type 2 diabetes mellitus with other circulatory complications: Secondary | ICD-10-CM

## 2022-01-10 ENCOUNTER — Other Ambulatory Visit (HOSPITAL_COMMUNITY): Payer: Self-pay | Admitting: Internal Medicine

## 2022-01-10 DIAGNOSIS — N63 Unspecified lump in unspecified breast: Secondary | ICD-10-CM

## 2022-01-17 ENCOUNTER — Ambulatory Visit (HOSPITAL_COMMUNITY)
Admission: RE | Admit: 2022-01-17 | Discharge: 2022-01-17 | Disposition: A | Discharge status: Home / Self Care | Payer: No Typology Code available for payment source | Source: Ambulatory Visit | Attending: Internal Medicine | Admitting: Internal Medicine

## 2022-01-17 ENCOUNTER — Ambulatory Visit (HOSPITAL_COMMUNITY)
Admission: RE | Admit: 2022-01-17 | Discharge: 2022-01-17 | Disposition: A | Discharge status: Home / Self Care | Payer: No Typology Code available for payment source | Source: Ambulatory Visit | Attending: Gerontology | Admitting: Gerontology

## 2022-01-17 DIAGNOSIS — N63 Unspecified lump in unspecified breast: Secondary | ICD-10-CM | POA: Insufficient documentation

## 2022-01-17 DIAGNOSIS — E1159 Type 2 diabetes mellitus with other circulatory complications: Secondary | ICD-10-CM | POA: Insufficient documentation

## 2022-09-03 ENCOUNTER — Ambulatory Visit: Payer: Self-pay | Admitting: "Endocrinology

## 2022-10-10 IMAGING — MG DIGITAL DIAGNOSTIC BILAT W/ TOMO W/ CAD
6 of 12 series · 6 of 36 positions shown · non-contrast
Comparison: None.

ACR Breast Density Category a: The breast tissue is almost entirely
fatty.

CLINICAL DATA: 50-year-old male presenting for evaluation of
bilateral breast tenderness and a retroareolar palpable lump on the
left side. He also feels his left breast is larger than is right.

EXAM:
DIGITAL DIAGNOSTIC BILATERAL MAMMOGRAM WITH TOMOSYNTHESIS AND CAD
TECHNIQUE: Bilateral digital diagnostic mammography and breast tomosynthesis
was performed. The images were evaluated with computer-aided
detection.

[L MLO synth-2D]
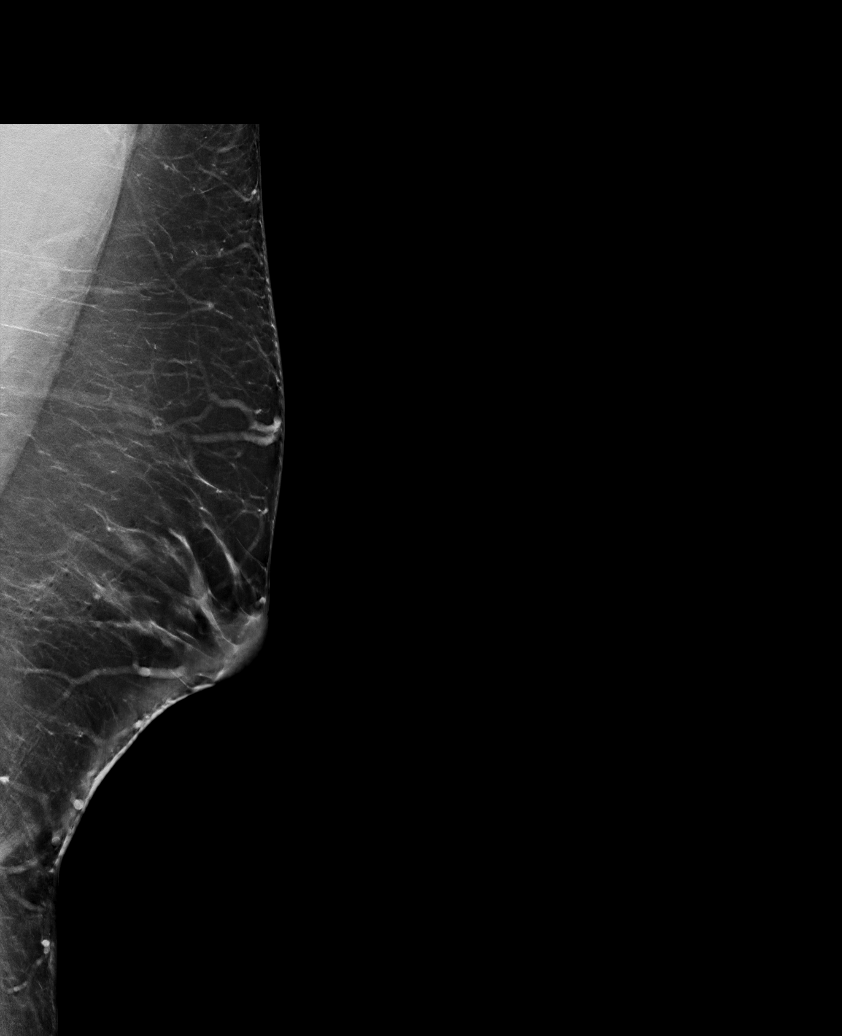

[R MLO synth-2D]
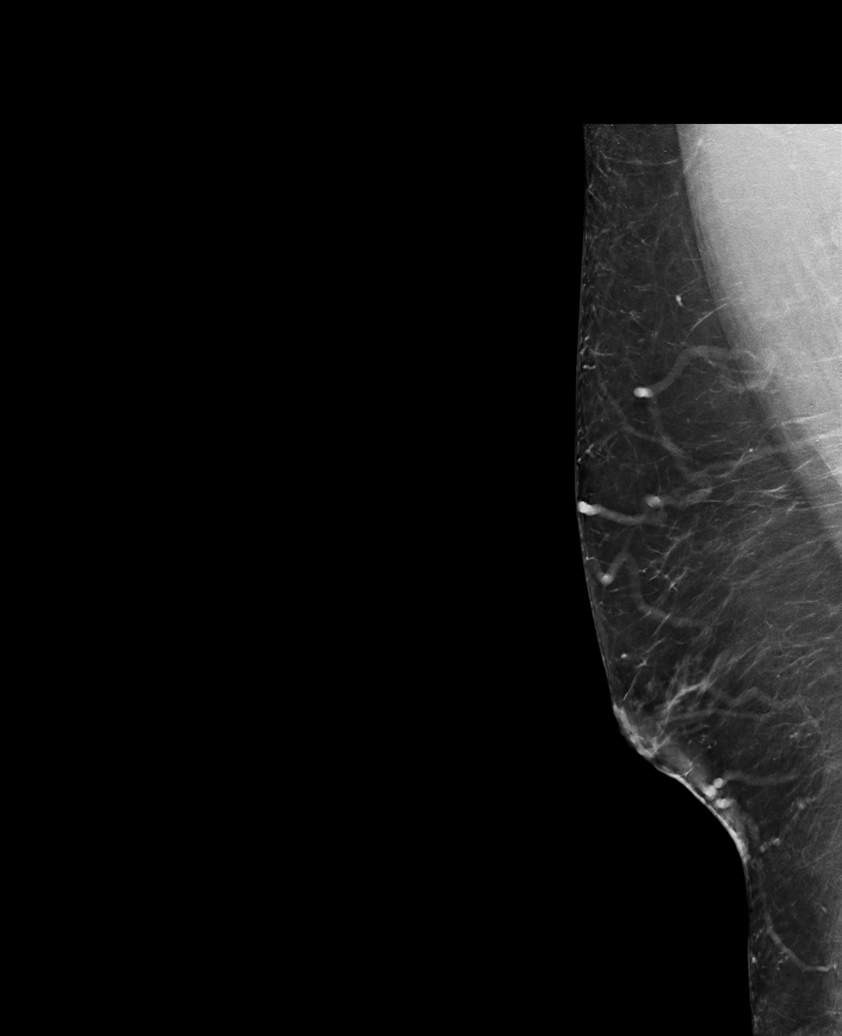

[L TAN synth-2D]
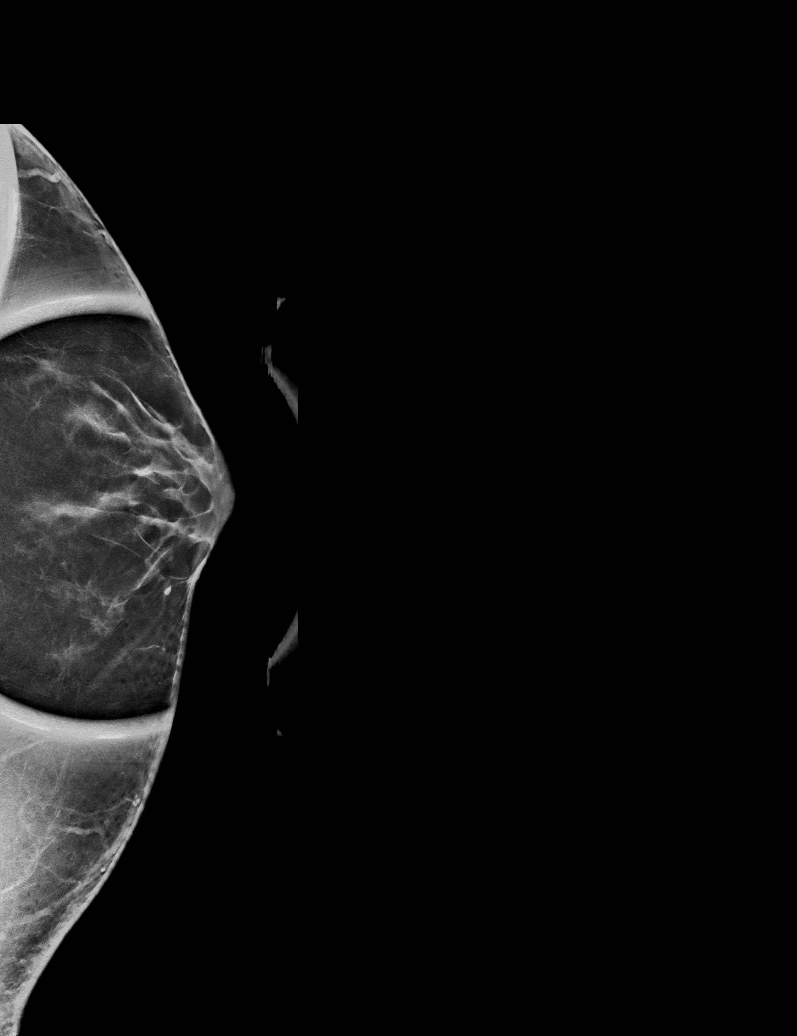

[R TAN synth-2D]
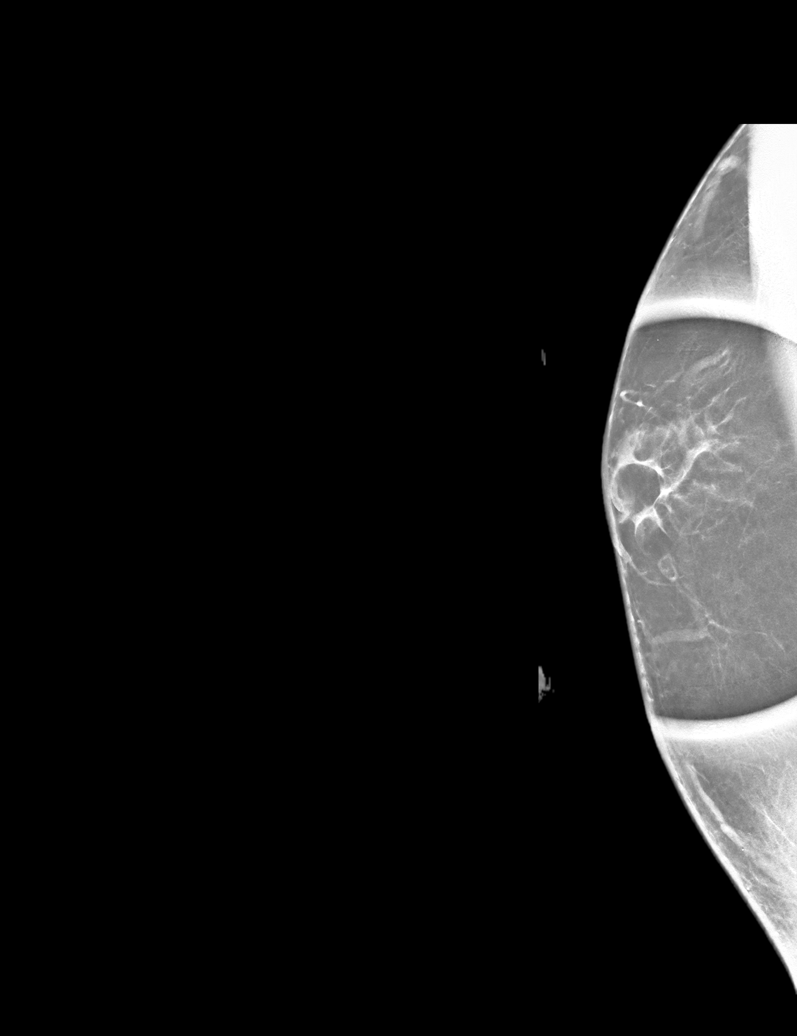

[L CC synth-2D]
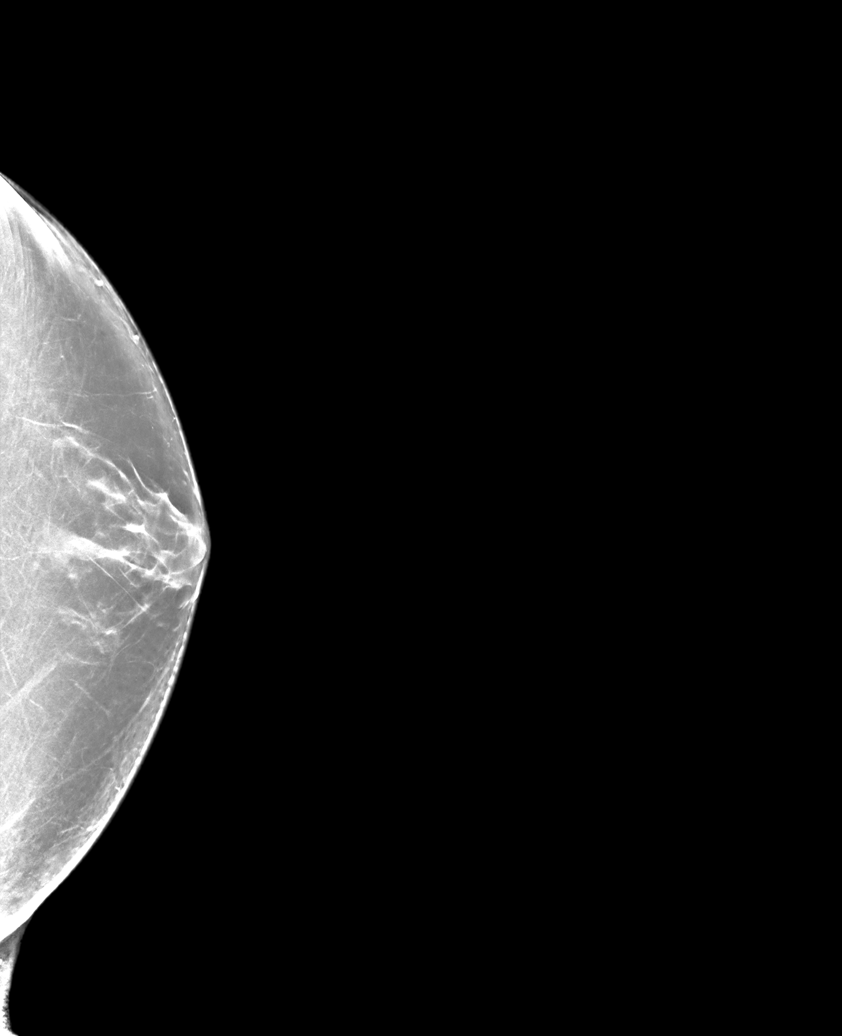

[R CC synth-2D]
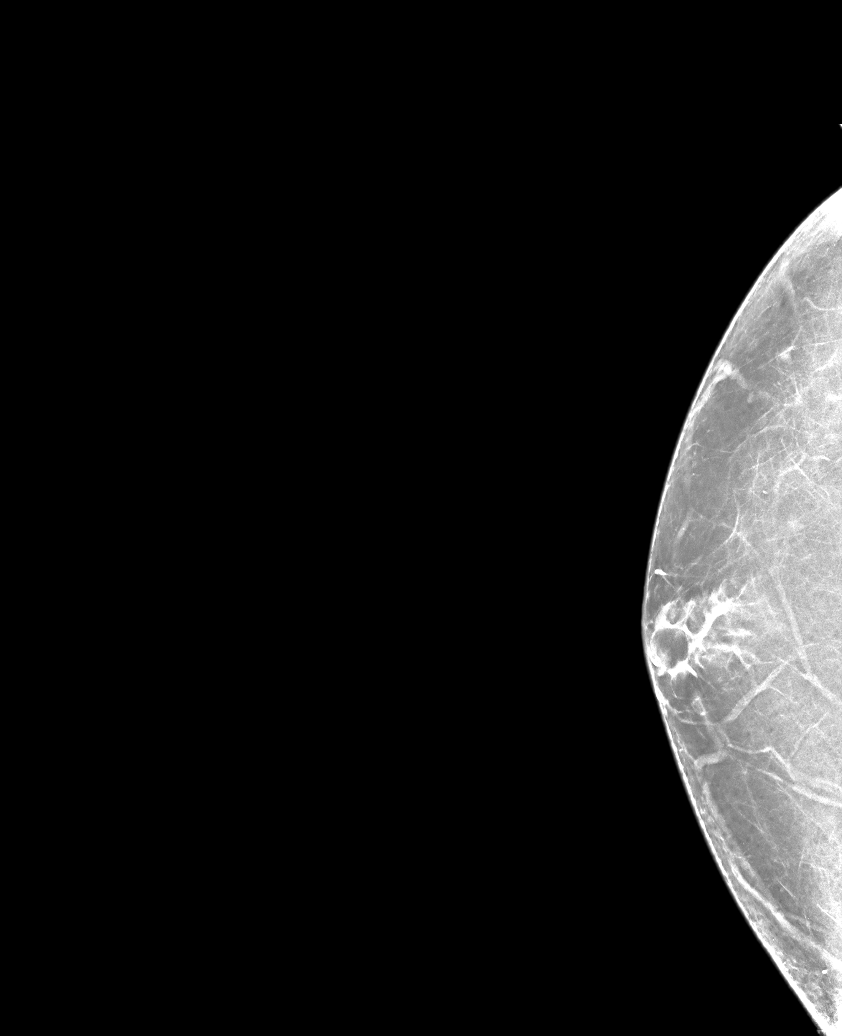

[6 of 36 positions shown; findings below may reference images not displayed]

FINDINGS: There is flame shaped fibroglandular tissue in the retroareolar
breasts bilaterally. No suspicious calcifications, masses or areas
of distortion are seen in the bilateral breasts.
IMPRESSION: 1. Benign mild bilateral gynecomastia. No mammographic evidence of
malignancy in the bilateral breasts.

RECOMMENDATION:
Clinical follow-up for symptomatic benign gynecomastia.

I have discussed the findings and recommendations with the patient.
If applicable, a reminder letter will be sent to the patient
regarding the next appointment.

BI-RADS CATEGORY  2: Benign.

## 2022-10-17 ENCOUNTER — Ambulatory Visit: Payer: Self-pay | Admitting: "Endocrinology

## 2022-11-26 ENCOUNTER — Encounter: Payer: Self-pay | Admitting: "Endocrinology

## 2022-12-19 ENCOUNTER — Emergency Department (HOSPITAL_COMMUNITY): Payer: No Typology Code available for payment source

## 2022-12-19 ENCOUNTER — Emergency Department (HOSPITAL_COMMUNITY)
Admission: EM | Admit: 2022-12-19 | Discharge: 2022-12-19 | Disposition: A | Discharge status: Home / Self Care | Payer: No Typology Code available for payment source | Source: Home / Self Care | Attending: Emergency Medicine | Admitting: Emergency Medicine

## 2022-12-19 ENCOUNTER — Encounter (HOSPITAL_COMMUNITY): Payer: Self-pay | Admitting: Emergency Medicine

## 2022-12-19 ENCOUNTER — Ambulatory Visit: Payer: No Typology Code available for payment source | Admitting: "Endocrinology

## 2022-12-19 ENCOUNTER — Other Ambulatory Visit (HOSPITAL_COMMUNITY): Payer: No Typology Code available for payment source

## 2022-12-19 ENCOUNTER — Encounter: Payer: Self-pay | Admitting: "Endocrinology

## 2022-12-19 ENCOUNTER — Other Ambulatory Visit: Payer: Self-pay

## 2022-12-19 VITALS — BP 166/94 | HR 120 | Ht 67.0 in | Wt 311.2 lb

## 2022-12-19 DIAGNOSIS — E1169 Type 2 diabetes mellitus with other specified complication: Secondary | ICD-10-CM | POA: Diagnosis not present

## 2022-12-19 DIAGNOSIS — R000 Tachycardia, unspecified: Secondary | ICD-10-CM | POA: Insufficient documentation

## 2022-12-19 DIAGNOSIS — E119 Type 2 diabetes mellitus without complications: Secondary | ICD-10-CM | POA: Insufficient documentation

## 2022-12-19 DIAGNOSIS — Z7982 Long term (current) use of aspirin: Secondary | ICD-10-CM | POA: Insufficient documentation

## 2022-12-19 DIAGNOSIS — R600 Localized edema: Secondary | ICD-10-CM | POA: Insufficient documentation

## 2022-12-19 DIAGNOSIS — Z7984 Long term (current) use of oral hypoglycemic drugs: Secondary | ICD-10-CM | POA: Insufficient documentation

## 2022-12-19 DIAGNOSIS — E782 Mixed hyperlipidemia: Secondary | ICD-10-CM

## 2022-12-19 DIAGNOSIS — L03116 Cellulitis of left lower limb: Secondary | ICD-10-CM | POA: Diagnosis not present

## 2022-12-19 DIAGNOSIS — I10 Essential (primary) hypertension: Secondary | ICD-10-CM | POA: Diagnosis not present

## 2022-12-19 DIAGNOSIS — Z794 Long term (current) use of insulin: Secondary | ICD-10-CM

## 2022-12-19 DIAGNOSIS — M79605 Pain in left leg: Secondary | ICD-10-CM

## 2022-12-19 LAB — CBC WITH DIFFERENTIAL/PLATELET
Abs Immature Granulocytes: 0.08 10*3/uL — ABNORMAL HIGH (ref 0.00–0.07)
Basophils Absolute: 0 10*3/uL (ref 0.0–0.1)
Basophils Relative: 0 %
Eosinophils Absolute: 0.1 10*3/uL (ref 0.0–0.5)
Eosinophils Relative: 1 %
HCT: 45.1 % (ref 39.0–52.0)
Hemoglobin: 14.7 g/dL (ref 13.0–17.0)
Immature Granulocytes: 1 %
Lymphocytes Relative: 5 %
Lymphs Abs: 0.8 10*3/uL (ref 0.7–4.0)
MCH: 32.1 pg (ref 26.0–34.0)
MCHC: 32.6 g/dL (ref 30.0–36.0)
MCV: 98.5 fL (ref 80.0–100.0)
Monocytes Absolute: 0.7 10*3/uL (ref 0.1–1.0)
Monocytes Relative: 5 %
Neutro Abs: 12.3 10*3/uL — ABNORMAL HIGH (ref 1.7–7.7)
Neutrophils Relative %: 88 %
Platelets: 230 10*3/uL (ref 150–400)
RBC: 4.58 MIL/uL (ref 4.22–5.81)
RDW: 14.2 % (ref 11.5–15.5)
WBC: 14 10*3/uL — ABNORMAL HIGH (ref 4.0–10.5)
nRBC: 0 % (ref 0.0–0.2)

## 2022-12-19 LAB — CULTURE, BLOOD (ROUTINE X 2)

## 2022-12-19 LAB — BASIC METABOLIC PANEL
Anion gap: 9 (ref 5–15)
BUN: 16 mg/dL (ref 6–20)
CO2: 26 mmol/L (ref 22–32)
Calcium: 9.8 mg/dL (ref 8.9–10.3)
Chloride: 102 mmol/L (ref 98–111)
Creatinine, Ser: 0.88 mg/dL (ref 0.61–1.24)
GFR, Estimated: >60 mL/min (ref >60)
Glucose, Bld: 333 mg/dL — ABNORMAL HIGH (ref 70–99)
Potassium: 3.7 mmol/L (ref 3.5–5.1)
Sodium: 137 mmol/L (ref 135–145)

## 2022-12-19 LAB — LACTIC ACID, PLASMA: Lactic Acid, Venous: 1.6 mmol/L (ref 0.5–1.9)

## 2022-12-19 LAB — POCT GLYCOSYLATED HEMOGLOBIN (HGB A1C): HbA1c, POC (controlled diabetic range): 14 % — AB (ref 0.0–7.0)

## 2022-12-19 LAB — BRAIN NATRIURETIC PEPTIDE: B Natriuretic Peptide: 27 pg/mL (ref 0.0–100.0)

## 2022-12-19 MED ORDER — CEPHALEXIN 500 MG PO CAPS: 500.0000 mg | ORAL_CAPSULE | Freq: Four times a day (QID) | ORAL | 0 refills | Status: DC

## 2022-12-19 NOTE — Discharge Instructions (Addendum)
You were seen in the emergency department for swollen leg and leg pain.  Your blood work showed an elevated infection cell count and elevated blood sugar.  You had an ultrasound that did not show any evidence of blood clot.  We are putting you on antibiotics for possible infection in the leg.  Please follow-up with your primary care doctor for further evaluation.  Return to the emergency department if any worsening or concerning symptoms.

## 2022-12-19 NOTE — Patient Instructions (Signed)
                                     Advice for Weight Management  -For most of us the best way to lose weight is by diet management. Generally speaking, diet management means consuming less calories intentionally which over time brings about progressive weight loss.  This can be achieved more effectively by avoiding ultra processed carbohydrates, processed meats, unhealthy fats.    It is critically important to know your numbers: how much calorie you are consuming and how much calorie you need. More importantly, our carbohydrates sources should be unprocessed naturally occurring  complex starch food items.  It is always important to balance nutrition also by  appropriate intake of proteins (mainly plant-based), healthy fats/oils, plenty of fruits and vegetables.   -The American College of Lifestyle Medicine (ACL M) recommends nutrition derived mostly from Whole Food, Plant Predominant Sources example an apple instead of applesauce or apple pie. Eat Plenty of vegetables, Mushrooms, fruits, Legumes, Whole Grains, Nuts, seeds in lieu of processed meats, processed snacks/pastries red meat, poultry, eggs.  Use only water or unsweetened tea for hydration.  The College also recommends the need to stay away from risky substances including alcohol, smoking; obtaining 7-9 hours of restorative sleep, at least 150 minutes of moderate intensity exercise weekly, importance of healthy social connections, and being mindful of stress and seek help when it is overwhelming.    -Sticking to a routine mealtime to eat 3 meals a day and avoiding unnecessary snacks is shown to have a big role in weight control. Under normal circumstances, the only time we burn stored energy is when we are hungry, so allow  some hunger to take place- hunger means no food between appropriate meal times, only water.  It is not advisable to starve.   -It is better to avoid simple carbohydrates including:  Cakes, Sweet Desserts, Ice Cream, Soda (diet and regular), Sweet Tea, Candies, Chips, Cookies, Store Bought Juices, Alcohol in Excess of  1-2 drinks a day, Lemonade,  Artificial Sweeteners, Doughnuts, Coffee Creamers, "Sugar-free" Products, etc, etc.  This is not a complete list.....    -Consulting with certified diabetes educators is proven to provide you with the most accurate and current information on diet.  Also, you may be  interested in discussing diet options/exchanges , we can schedule a visit with Jonathan Sellers, RDN, CDE for individualized nutrition education.  -Exercise: If you are able: 30 -60 minutes a day ,4 days a week, or 150 minutes of moderate intensity exercise weekly.    The longer the better if tolerated.  Combine stretch, strength, and aerobic activities.  If you were told in the past that you have high risk for cardiovascular diseases, or if you are currently symptomatic, you may seek evaluation by your heart doctor prior to initiating moderate to intense exercise programs.                                  Additional Care Considerations for Diabetes/Prediabetes   -Diabetes  is a chronic disease.  The most important care consideration is regular follow-up with your diabetes care provider with the goal being avoiding or delaying its complications and to take advantage of advances in medications and technology.  If appropriate actions are taken early enough, type 2 diabetes can even be   reversed.  Seek information from the right source.  - Whole Food, Plant Predominant Nutrition is highly recommended: Eat Plenty of vegetables, Mushrooms, fruits, Legumes, Whole Grains, Nuts, seeds in lieu of processed meats, processed snacks/pastries red meat, poultry, eggs as recommended by American College of  Lifestyle Medicine (ACLM).  -Type 2 diabetes is known to coexist with other important comorbidities such as high blood pressure and high cholesterol.  It is critical to control not only the  diabetes but also the high blood pressure and high cholesterol to minimize and delay the risk of complications including coronary artery disease, stroke, amputations, blindness, etc.  The good news is that this diet recommendation for type 2 diabetes is also very helpful for managing high cholesterol and high blood blood pressure.  - Studies showed that people with diabetes will benefit from a class of medications known as ACE inhibitors and statins.  Unless there are specific reasons not to be on these medications, the standard of care is to consider getting one from these groups of medications at an optimal doses.  These medications are generally considered safe and proven to help protect the heart and the kidneys.    - People with diabetes are encouraged to initiate and maintain regular follow-up with eye doctors, foot doctors, dentists , and if necessary heart and kidney doctors.     - It is highly recommended that people with diabetes quit smoking or stay away from smoking, and get yearly  flu vaccine and pneumonia vaccine at least every 5 years.  See above for additional recommendations on exercise, sleep, stress management , and healthy social connections.      

## 2022-12-19 NOTE — ED Notes (Signed)
Attempted to call pt to inform he left medication bottles in ER room. No answer at this time.

## 2022-12-19 NOTE — ED Notes (Signed)
Pt verbalized understanding of discharge paperwork and follow-up care. Pts son helped dress pt and wheel him to car.

## 2022-12-19 NOTE — Progress Notes (Unsigned)
Endocrinology Consult Note       12/19/2022, 4:17 PM   Subjective:    Patient ID: Jonathan Sellers, male    DOB: 03-23-1971.  THEON SOBOTKA is being seen in consultation for management of currently uncontrolled symptomatic diabetes requested by  Benetta Spar, MD.   Past Medical History:  Diagnosis Date   Diabetes (HCC)    HLD (hyperlipidemia)    HTN (hypertension)    Sleep apnea     Past Surgical History:  Procedure Laterality Date   COLONOSCOPY WITH PROPOFOL N/A 07/12/2020   Procedure: COLONOSCOPY WITH PROPOFOL;  Surgeon: Lanelle Bal, DO;  Location: [facility];  Service: Endoscopy;  Laterality: N/A;  8:00am   POLYPECTOMY  07/12/2020   Procedure: POLYPECTOMY;  Surgeon: Lanelle Bal, DO;  Location: [facility];  Service: Endoscopy;;   WISDOM TOOTH EXTRACTION      Social History   Socioeconomic History   Marital status: Married    Spouse name: Not on file   Number of children: Not on file   Years of education: Not on file   Highest education level: Not on file  Occupational History   Not on file  Tobacco Use   Smoking status: Never   Smokeless tobacco: Never  Vaping Use   Vaping Use: Never used  Substance and Sexual Activity   Alcohol use: No   Drug use: No   Sexual activity: Not on file  Other Topics Concern   Not on file  Social History Narrative   Not on file   Social Determinants of Health   Financial Resource Strain: Not on file  Food Insecurity: Not on file  Transportation Needs: Not on file  Physical Activity: Not on file  Stress: Not on file  Social Connections: Not on file    Family History  Problem Relation Age of Onset   Colon cancer Neg Hx     Outpatient Encounter Medications as of 12/19/2022  Medication Sig   ASPIRIN LOW DOSE 81 MG tablet Take 81 mg by mouth daily.   glipiZIDE (GLUCOTROL) 10 MG tablet Take 10 mg by mouth 2 (two) times  daily before a meal.   LANTUS SOLOSTAR 100 UNIT/ML Solostar Pen Inject 60 Units into the skin at bedtime.   lisinopril-hydrochlorothiazide (ZESTORETIC) 20-12.5 MG tablet Take 1 tablet by mouth 2 (two) times daily.   metFORMIN (GLUCOPHAGE) 500 MG tablet Take 500 mg by mouth 2 (two) times daily.   potassium chloride (KLOR-CON) 10 MEQ tablet Take 10 mEq by mouth daily.   torsemide (DEMADEX) 20 MG tablet Take 20 mg by mouth daily.   [DISCONTINUED] lisinopril (ZESTRIL) 20 MG tablet Take 20 mg by mouth daily.   acetaminophen (TYLENOL) 500 MG tablet Take 1,000 mg by mouth 2 (two) times daily as needed for moderate pain or headache.   atorvastatin (LIPITOR) 20 MG tablet Take 40 mg by mouth daily.   OVER THE COUNTER MEDICATION Apply 1 application topically daily as needed (pain). CBD cream   sodium chloride (OCEAN) 0.65 % SOLN nasal spray Place 1 spray into both nostrils as needed for congestion.   [  DISCONTINUED] lisinopril-hydrochlorothiazide (ZESTORETIC) 20-12.5 MG tablet Take 1 tablet by mouth in the morning and at bedtime. (Patient not taking: Reported on 12/19/2022)   [DISCONTINUED] metFORMIN (GLUCOPHAGE-XR) 750 MG 24 hr tablet Take 750 mg by mouth 2 (two) times daily. (Patient not taking: Reported on 12/19/2022)   [DISCONTINUED] naproxen sodium (ALEVE) 220 MG tablet Take 660 mg by mouth daily as needed (pain). (Patient not taking: Reported on 12/19/2022)   [DISCONTINUED] Phenylephrine-Acetaminophen 5-325 MG CAPS Take 2 tablets by mouth daily as needed (congestion). (Patient not taking: Reported on 12/19/2022)   No facility-administered encounter medications on file as of 12/19/2022.    ALLERGIES: No Known Allergies  VACCINATION STATUS: Immunization History  Administered Date(s) Administered   Moderna Sars-Covid-2 Vaccination 10/31/2019, 12/02/2019    HPI   Review of Systems  Objective:       12/19/2022    2:24 PM 12/19/2022    1:00 PM 12/19/2022   12:30 PM  Vitals with BMI  Systolic 158 144 213   Diastolic 104 89 103  Pulse 109 106 106    BP (!) 166/94 Comment: L arm with manuel cuff  Pulse (!) 120   Ht 5\' 7"  (1.702 m)   Wt (!) 311 lb 3.2 oz (141.2 kg)   BMI 48.74 kg/m   Wt Readings from Last 3 Encounters:  12/19/22 (!) 310 lb 13.6 oz (141 kg)  12/19/22 (!) 311 lb 3.2 oz (141.2 kg)  07/07/20 (!) (P) 305 lb (138.3 kg)     Physical Exam    CMP ( most recent) CMP     Component Value Date/Time   NA 137 12/19/2022 1101   K 3.7 12/19/2022 1101   CL 102 12/19/2022 1101   CO2 26 12/19/2022 1101   GLUCOSE 333 (H) 12/19/2022 1101   BUN 16 12/19/2022 1101   CREATININE 0.88 12/19/2022 1101   CALCIUM 9.8 12/19/2022 1101   PROT 7.3 03/11/2015 1520   ALBUMIN 3.8 03/11/2015 1520   AST 34 03/11/2015 1520   ALT 37 03/11/2015 1520   ALKPHOS 98 03/11/2015 1520   BILITOT 0.9 03/11/2015 1520   GFRNONAA >60 12/19/2022 1101   GFRAA >60 03/11/2015 1520     Diabetic Labs (most recent): Lab Results  Component Value Date   HGBA1C 14.0 (A) 12/19/2022     Lipid Panel ( most recent) Lipid Panel  No results found for: "CHOL", "TRIG", "HDL", "CHOLHDL", "VLDL", "LDLCALC", "LDLDIRECT", "LABVLDL"    No results found for: "TSH", "FREET4"         Assessment & Plan:   1. Type 2 diabetes mellitus with other specified complication, with long-term current use of insulin (HCC) *** - HgB A1c - Amb ref to Medical Nutrition Therapy-MNT  2. Essential hypertension, benign ***  3. Mixed hyperlipidemia ***  4. Morbid obesity (HCC) ***   - MENA LIENAU has currently uncontrolled symptomatic type 2 DM since  *** years of age,  with most recent A1c of *** %. Recent labs reviewed. - I had a long discussion with him about the possible risk factors and  the pathology behind its diabetes and its complications. -his diabetes is complicated by *** and he remains at a high risk for more acute and chronic complications which include CAD, CVA, CKD, retinopathy, and neuropathy. These are  all discussed in detail with him.  - I discussed all available options of managing his diabetes including de-escalation of medications. I have counseled him on Food as Medicine by adopting a [employer] ,  Plant Predominant  ( WFPP) nutrition as recommended by [employer] of Lifestyle Medicine. Patient is encouraged to switch to  unprocessed or minimally processed  complex starch, adequate protein intake (mainly plant source), minimal liquid fat, plenty of fruits, and vegetables. -  he is advised to stick to a routine mealtimes to eat 3 complete meals a day and snack only when necessary ( to snack only to correct hypoglycemia BG <70 day time or <100 at night).   - he acknowledges that there is a room for improvement in his food and drink choices. - Further Specific Suggestion is made for him to avoid simple carbohydrates  from his diet including Cakes, Sweet Desserts, Ice Cream, Soda (diet and regular), Sweet Tea, Candies, Chips, Cookies, Store Bought Juices, Alcohol ,  Artificial Sweeteners,  Coffee Creamer, and "Sugar-free" Products. This will help patient to have more stable blood glucose profile and potentially avoid unintended weight gain.  The following Lifestyle Medicine recommendations according to American College of Lifestyle Medicine [facility]) were discussed and offered to patient and he agrees to start the journey:  A.  Whole Foods, Plant-based plate comprising of fruits and vegetables, plant-based proteins, whole-grain carbohydrates was discussed in detail with the patient.   A list for source of those nutrients were also provided to the patient.  Patient will use only water or unsweetened tea for hydration. B.  The need to stay away from risky substances including alcohol, smoking; obtaining 7 to 9 hours of restorative sleep, at least 150 minutes of moderate intensity exercise weekly, the importance of healthy social connections,  and stress reduction techniques were discussed. C.  A full  color page of  Calorie density of various food groups per pound showing examples of each food groups was provided to the patient.  - he will be scheduled with Norm Salt, RDN, CDE for individualized diabetes education.  - I have approached him with the following individualized plan to manage  his diabetes and patient agrees:   - he will be initiated on basal insulin *** units daily at bedtime , and prandial insulin *** units 3 times a day with meals  for pre-meal BG readings of 90-150mg /dl, plus patient specific correction dose for unexpected hyperglycemia above 150mg /dl, associated with strict monitoring of glucose 4 times a day-before meals and at bedtime. - he is warned not to take insulin without proper monitoring per orders. - Adjustment parameters are given to him for hypo and hyperglycemia in writing. - he is encouraged to call clinic for blood glucose levels less than 70 or above 300 mg /dl. - he is advised to continue ***, therapeutically suitable for patient . - his *** will be discontinued, risk outweighs benefit for this patient. - he is not a candidate for *** due to concurrent renal insufficiency.  - he will be considered for incretin therapy as appropriate next visit.  - Specific targets for  A1c;  LDL, HDL,  and Triglycerides were discussed with the patient.  2) Blood Pressure /Hypertension:  his blood pressure is *** controlled to target.   he is advised to continue his current medications including ***  mg p.o. daily with breakfast . 3) Lipids/Hyperlipidemia:   Review of his recent lipid panel showed *** controlled  LDL at *** .  he  is advised to continue    *** mg daily at bedtime.  Side effects and precautions discussed with him.  4)  Weight/Diet:  Body mass index is 48.74 kg/m.  -  ***  clearly complicating his diabetes care.   he is *** a candidate for weight loss. I discussed with him the fact that loss of 5 - 10% of his  current body weight will have the most impact  on his diabetes management.  The above detailed  ACLM recommendations for nutrition, exercise, sleep, social life, avoidance of risky substances, the need for restorative sleep   information will also detailed on discharge instructions.  5) Chronic Care/Health Maintenance:  -he  *** on ACEI/ARB and Statin medications and  is encouraged to initiate and continue to follow up with Ophthalmology, Dentist,  Podiatrist at least yearly or according to recommendations, and advised to  *** stay away from smoking. I have recommended yearly flu vaccine and pneumonia vaccine at least every 5 years; moderate intensity exercise for up to 150 minutes weekly; and  sleep for 7- 9 hours a day.  - he is  advised to maintain close follow up with Benetta Spar, MD for primary care needs, as well as his other providers for optimal and coordinated care.   I spent 6*** minutes in the care of the patient today including review of labs from CMP, Lipids, Thyroid Function, Hematology (current and previous including abstractions from other facilities); face-to-face time discussing  his blood glucose readings/logs, discussing hypoglycemia and hyperglycemia episodes and symptoms, medications doses, his options of short and long term treatment based on the latest standards of care / guidelines;  discussion about incorporating lifestyle medicine;  and documenting the encounter. Risk reduction counseling performed per USPSTF guidelines to reduce *** obesity and cardiovascular risk factors.      Please refer to Patient Instructions for Blood Glucose Monitoring and Insulin/Medications Dosing Guide"  in media tab for additional information. Please  also refer to " Patient Self Inventory" in the Media  tab for reviewed elements of pertinent patient history.  Meyer Cory participated in the discussions, expressed understanding, and voiced agreement with the above plans.  All questions were answered to his satisfaction. he is  encouraged to contact clinic should he have any questions or concerns prior to his return visit.   Follow up plan: - Return in about 10 days (around 12/29/2022), or To ER for better evaluation and treatment., for F/U with Meter/CGM /Logs Only - no Labs.  Marquis Lunch, MD [facility] [address] [place], [place] [address] Phone: [phone]  Fax: [phone]    12/19/2022, 4:17 PM  This note was partially dictated with voice recognition software. Similar sounding words can be transcribed inadequately or may not  be corrected upon review.

## 2022-12-19 NOTE — ED Provider Notes (Signed)
[place] [facility] AT [facility] Provider Note   CSN: [id number] Arrival date & time: 12/19/22  1029     History  Chief Complaint  Patient presents with   Leg Swelling    Jonathan Sellers is a 52 y.o. male.  He has a history of peripheral edema and diabetes.  He is on diuretics and diabetic medications.  His ANC has been elevated and so he saw endocrinology today who referred him here for left leg pain concern for DVT.  Patient states his legs are always swollen, but today started with some pain in his left inner knee radiating up into his inner thigh.  No numbness or weakness.  No known trauma.  He denies any fevers or otherwise feeling sick although he does feel very thirsty.  Blood sugars run in the 2 and 300s.  No chest pain or shortness of breath.  The history is provided by the patient.  Leg Pain Location:  Leg Leg location:  L upper leg Pain details:    Quality:  Aching   Severity:  Moderate   Onset quality:  Gradual   Duration:  1 day   Timing:  Constant   Progression:  Unchanged Chronicity:  New Relieved by:  None tried Worsened by:  Activity Ineffective treatments:  None tried Associated symptoms: swelling   Associated symptoms: no fever, no muscle weakness and no numbness        Home Medications Prior to Admission medications   Medication Sig Start Date End Date Taking? Authorizing Provider  acetaminophen (TYLENOL) 500 MG tablet Take 1,000 mg by mouth 2 (two) times daily as needed for moderate pain or headache.    [provider]  ASPIRIN LOW DOSE 81 MG tablet Take 81 mg by mouth daily. 07/29/22   [provider]  atorvastatin (LIPITOR) 20 MG tablet Take 40 mg by mouth daily.    [provider]  glipiZIDE (GLUCOTROL) 10 MG tablet Take 10 mg by mouth daily with breakfast. 10/19/22   [provider]  LANTUS SOLOSTAR 100 UNIT/ML Solostar Pen Inject 60 Units into the skin at bedtime. 11/07/22   [provider]  lisinopril-hydrochlorothiazide (ZESTORETIC) 20-12.5 MG tablet Take 1 tablet by mouth 2 (two) times daily.    [provider]  metFORMIN (GLUCOPHAGE) 500 MG tablet Take 500 mg by mouth 2 (two) times daily. 10/19/22   [provider]  OVER THE COUNTER MEDICATION Apply 1 application topically daily as needed (pain). CBD cream    [provider]  potassium chloride (KLOR-CON) 10 MEQ tablet Take 10 mEq by mouth daily. 10/03/22   [provider]  sodium chloride (OCEAN) 0.65 % SOLN nasal spray Place 1 spray into both nostrils as needed for congestion.    [provider]  torsemide (DEMADEX) 20 MG tablet Take 20 mg by mouth daily. 07/08/22   [provider]      Allergies    Patient has no known allergies.    Review of Systems   Review of Systems  Constitutional:  Negative for fever.  Respiratory:  Negative for shortness of breath.   Cardiovascular:  Positive for leg swelling. Negative for chest pain.  Skin:  Negative for rash.    Physical Exam Updated Vital Signs BP (!) 160/107   Pulse (!) 116   Temp 99.3 F (37.4 C)   Resp 18   Ht 5\' 7"  (1.702 m)   Wt (!) 141 kg   SpO2 97%  BMI 48.69 kg/m  Physical Exam Vitals and nursing note reviewed.  Constitutional:      General: He is not in acute distress.    Appearance: Normal appearance. He is well-developed. He is obese.  HENT:     Head: Normocephalic and atraumatic.  Eyes:     Conjunctiva/sclera: Conjunctivae normal.  Cardiovascular:     Rate and Rhythm: Regular rhythm. Tachycardia present.     Heart sounds: No murmur heard. Pulmonary:     Effort: Pulmonary effort is normal. No respiratory distress.     Breath sounds: Normal breath sounds.  Abdominal:     Palpations: Abdomen is soft.     Tenderness: There is no abdominal tenderness.  Musculoskeletal:        General: Tenderness present.     Cervical back: Neck supple.     Right lower leg: Edema present.      Left lower leg: Edema present.     Comments: Both of his legs are markedly swollen.  There is no pitting edema.  He has some tenderness of his left inner knee and left medial thigh although I do not appreciate any erythema or increased warmth.  No crepitus.  Skin:    General: Skin is warm and dry.     Capillary Refill: Capillary refill takes less than 2 seconds.     Findings: No erythema or rash.  Neurological:     General: No focal deficit present.     Mental Status: He is alert.     Motor: No weakness.     ED Results / Procedures / Treatments   Labs (all labs ordered are listed, but only abnormal results are displayed) Labs Reviewed  BASIC METABOLIC PANEL - Abnormal; Notable for the following components:      Result Value   Glucose, Bld 333 (*)    All other components within normal limits  CBC WITH DIFFERENTIAL/PLATELET - Abnormal; Notable for the following components:   WBC 14.0 (*)    Neutro Abs 12.3 (*)    Abs Immature Granulocytes 0.08 (*)    All other components within normal limits  CULTURE, BLOOD (ROUTINE X 2)  CULTURE, BLOOD (ROUTINE X 2)  LACTIC ACID, PLASMA  BRAIN NATRIURETIC PEPTIDE    EKG EKG Interpretation  Date/Time:  Wednesday Dec 19 2022 11:13:01 EDT Ventricular Rate:  109 PR Interval:  142 QRS Duration: 91 QT Interval:  329 QTC Calculation: 443 R Axis:   19 Text Interpretation: Sinus tachycardia Baseline wander in lead(s) V6 increased rate from prior 11/21 Confirmed by Meridee Score [phone]) on 12/19/2022 11:20:22 AM  Radiology US Venous Img Lower  Left (DVT Study)  Result Date: 12/19/2022 CLINICAL DATA:  Leg pain EXAM: LEFT LOWER EXTREMITY VENOUS DOPPLER ULTRASOUND TECHNIQUE: Gray-scale sonography with compression, as well as color and duplex ultrasound, were performed to evaluate the deep venous system(s) from the level of the common femoral vein through the popliteal and proximal calf veins. COMPARISON:  None FINDINGS: VENOUS Normal compressibility  of the common femoral, superficial femoral, and popliteal veins, as well as the visualized calf veins. Visualized portions of profunda femoral vein and great saphenous vein unremarkable. No filling defects to suggest DVT on grayscale or color Doppler imaging. Doppler waveforms show normal direction of venous flow, normal respiratory plasticity and response to augmentation. Limited views of the contralateral common femoral vein are unremarkable. OTHER Subcutaneous edema Limitations: none IMPRESSION: 1. No evidence of LEFT lower extremity deep venous thrombosis. 2. Subcutaneous edema noted. Electronically Signed  By: Genevive Bi M.D.   On: 12/19/2022 12:05   DG Chest Port 1 View  Result Date: 12/19/2022 CLINICAL DATA:  Provided history: Leg pain. EXAM: PORTABLE CHEST 1 VIEW COMPARISON:  No pertinent prior exams available for comparison. FINDINGS: Cardiomegaly. Central pulmonary vascular congestion and suspected mild interstitial pulmonary edema. No appreciable airspace consolidation. No evidence of pleural effusion or pneumothorax. No acute osseous abnormality identified. IMPRESSION: 1. Cardiomegaly. 2. Central pulmonary vascular congestion and suspected mild interstitial pulmonary edema. Electronically Signed   By: Jackey Loge D.O.   On: 12/19/2022 11:24    Procedures Procedures    Medications Ordered in ED Medications - No data to display  ED Course/ Medical Decision Making/ A&P                             Medical Decision Making Amount and/or Complexity of Data Reviewed Labs: ordered. Radiology: ordered.  Risk Prescription drug management.   This patient complains of left leg pain; this involves an extensive number of treatment Options and is a complaint that carries with it a high risk of complications and morbidity. The differential includes DVT, peripheral edema, cellulitis, arterial occlusion, musculoskeletal  I ordered, reviewed and interpreted labs, which included CBC with  elevated white count stable hemoglobin, chemistries with normal gap and elevated glucose, lactate normal, BMP normal, blood culture sent I ordered imaging studies which included chest x-ray and left lower extremity duplex and I independently    visualized and interpreted imaging which showed cardiomegaly no acute findings Previous records obtained and reviewed in epic including endocrinology note today Cardiac monitoring reviewed, sinus tachycardia Social determinants considered, no significant barriers Critical Interventions: None  After the interventions stated above, I reevaluated the patient and found patient to be neurovascularly intact in no distress Admission and further testing considered, no indications for admission or further workup at this time.  Due to patient's elevated white count and pain in the leg will cover with antibiotics for possible cellulitis although do not appreciate any redness or warmth.  Patient has chronic peripheral edema.  Distal pulses intact no evidence of arterial occlusion.  Compartments soft no evidence of compartment syndrome.  Recommended close follow-up with PCP and return instructions discussed.         Final Clinical Impression(s) / ED Diagnoses Final diagnoses:  Bilateral lower extremity edema  Left leg pain    Rx / DC Orders ED Discharge Orders          Ordered    cephALEXin (KEFLEX) 500 MG capsule  4 times daily        12/19/22 1348              Terrilee Files, MD 12/19/22 1656

## 2022-12-19 NOTE — ED Triage Notes (Signed)
Pt sent from endocrinologist office where he is being seen for uncontrolled diabetes. Send for leg swelling and pain to left leg. Pain started today but swelling has been for awhile.

## 2022-12-20 ENCOUNTER — Encounter (HOSPITAL_COMMUNITY): Payer: Self-pay | Admitting: Emergency Medicine

## 2022-12-20 ENCOUNTER — Other Ambulatory Visit: Payer: Self-pay

## 2022-12-20 ENCOUNTER — Inpatient Hospital Stay (HOSPITAL_COMMUNITY): Payer: No Typology Code available for payment source

## 2022-12-20 ENCOUNTER — Inpatient Hospital Stay (HOSPITAL_COMMUNITY)
Admission: EM | Admit: 2022-12-20 | Discharge: 2022-12-22 | DRG: 603 | Disposition: A | Discharge status: Home / Self Care | Payer: No Typology Code available for payment source | Source: Ambulatory Visit | Attending: Internal Medicine | Admitting: Internal Medicine

## 2022-12-20 ENCOUNTER — Emergency Department (HOSPITAL_COMMUNITY): Payer: No Typology Code available for payment source

## 2022-12-20 DIAGNOSIS — Z7984 Long term (current) use of oral hypoglycemic drugs: Secondary | ICD-10-CM

## 2022-12-20 DIAGNOSIS — R0902 Hypoxemia: Secondary | ICD-10-CM | POA: Diagnosis present

## 2022-12-20 DIAGNOSIS — E8809 Other disorders of plasma-protein metabolism, not elsewhere classified: Secondary | ICD-10-CM | POA: Diagnosis present

## 2022-12-20 DIAGNOSIS — I10 Essential (primary) hypertension: Secondary | ICD-10-CM

## 2022-12-20 DIAGNOSIS — E46 Unspecified protein-calorie malnutrition: Secondary | ICD-10-CM | POA: Diagnosis present

## 2022-12-20 DIAGNOSIS — E1165 Type 2 diabetes mellitus with hyperglycemia: Secondary | ICD-10-CM | POA: Diagnosis not present

## 2022-12-20 DIAGNOSIS — R7881 Bacteremia: Secondary | ICD-10-CM

## 2022-12-20 DIAGNOSIS — I119 Hypertensive heart disease without heart failure: Secondary | ICD-10-CM | POA: Diagnosis present

## 2022-12-20 DIAGNOSIS — B951 Streptococcus, group B, as the cause of diseases classified elsewhere: Secondary | ICD-10-CM | POA: Diagnosis not present

## 2022-12-20 DIAGNOSIS — Z794 Long term (current) use of insulin: Secondary | ICD-10-CM | POA: Diagnosis not present

## 2022-12-20 DIAGNOSIS — G4730 Sleep apnea, unspecified: Secondary | ICD-10-CM | POA: Diagnosis present

## 2022-12-20 DIAGNOSIS — L03116 Cellulitis of left lower limb: Secondary | ICD-10-CM | POA: Diagnosis not present

## 2022-12-20 DIAGNOSIS — Z7982 Long term (current) use of aspirin: Secondary | ICD-10-CM

## 2022-12-20 DIAGNOSIS — Z79899 Other long term (current) drug therapy: Secondary | ICD-10-CM

## 2022-12-20 DIAGNOSIS — Z6841 Body Mass Index (BMI) 40.0 and over, adult: Secondary | ICD-10-CM | POA: Diagnosis not present

## 2022-12-20 DIAGNOSIS — E782 Mixed hyperlipidemia: Secondary | ICD-10-CM | POA: Diagnosis present

## 2022-12-20 LAB — URINALYSIS, W/ REFLEX TO CULTURE (INFECTION SUSPECTED)
Bacteria, UA: NONE SEEN
Bilirubin Urine: NEGATIVE
Glucose, UA: >=500 mg/dL — AB
Hgb urine dipstick: NEGATIVE
Ketones, ur: 20 mg/dL — AB
Leukocytes,Ua: NEGATIVE
Nitrite: NEGATIVE
Protein, ur: 100 mg/dL — AB
Specific Gravity, Urine: 1.033 — ABNORMAL HIGH (ref 1.005–1.030)
pH: 6 (ref 5.0–8.0)

## 2022-12-20 LAB — CBC WITH DIFFERENTIAL/PLATELET
Abs Immature Granulocytes: 0.05 10*3/uL (ref 0.00–0.07)
Basophils Absolute: 0 10*3/uL (ref 0.0–0.1)
Basophils Relative: 0 %
Eosinophils Absolute: 0 10*3/uL (ref 0.0–0.5)
Eosinophils Relative: 0 %
HCT: 46.3 % (ref 39.0–52.0)
Hemoglobin: 15.1 g/dL (ref 13.0–17.0)
Immature Granulocytes: 1 %
Lymphocytes Relative: 6 %
Lymphs Abs: 0.7 10*3/uL (ref 0.7–4.0)
MCH: 31.9 pg (ref 26.0–34.0)
MCHC: 32.6 g/dL (ref 30.0–36.0)
MCV: 97.7 fL (ref 80.0–100.0)
Monocytes Absolute: 0.4 10*3/uL (ref 0.1–1.0)
Monocytes Relative: 3 %
Neutro Abs: 9.7 10*3/uL — ABNORMAL HIGH (ref 1.7–7.7)
Neutrophils Relative %: 90 %
Platelets: 222 10*3/uL (ref 150–400)
RBC: 4.74 MIL/uL (ref 4.22–5.81)
RDW: 14.3 % (ref 11.5–15.5)
WBC: 10.8 10*3/uL — ABNORMAL HIGH (ref 4.0–10.5)
nRBC: 0 % (ref 0.0–0.2)

## 2022-12-20 LAB — BLOOD CULTURE ID PANEL (REFLEXED) - BCID2

## 2022-12-20 LAB — COMPREHENSIVE METABOLIC PANEL
ALT: 45 U/L — ABNORMAL HIGH (ref 0–44)
AST: 25 U/L (ref 15–41)
Albumin: 3.3 g/dL — ABNORMAL LOW (ref 3.5–5.0)
Alkaline Phosphatase: 122 U/L (ref 38–126)
Anion gap: 10 (ref 5–15)
BUN: 15 mg/dL (ref 6–20)
CO2: 22 mmol/L (ref 22–32)
Calcium: 9.6 mg/dL (ref 8.9–10.3)
Chloride: 103 mmol/L (ref 98–111)
Creatinine, Ser: 0.94 mg/dL (ref 0.61–1.24)
GFR, Estimated: >60 mL/min (ref >60)
Glucose, Bld: 369 mg/dL — ABNORMAL HIGH (ref 70–99)
Potassium: 4.4 mmol/L (ref 3.5–5.1)
Sodium: 135 mmol/L (ref 135–145)
Total Bilirubin: 0.8 mg/dL (ref 0.3–1.2)
Total Protein: 6.7 g/dL (ref 6.5–8.1)

## 2022-12-20 LAB — PROTIME-INR
INR: 1 (ref 0.8–1.2)
Prothrombin Time: 12.9 seconds (ref 11.4–15.2)

## 2022-12-20 LAB — ECHOCARDIOGRAM COMPLETE
AR max vel: 2.82 cm2
AV Area VTI: 3.03 cm2
AV Area mean vel: 2.94 cm2
AV Mean grad: 3.3 mmHg
AV Peak grad: 7.1 mmHg
Ao pk vel: 1.34 m/s
Area-P 1/2: 4.21 cm2
Height: 67 in
MV M vel: 0.95 m/s
MV Peak grad: 3.6 mmHg
S' Lateral: 3.3 cm
Weight: 4910.4 oz

## 2022-12-20 LAB — CULTURE, BLOOD (ROUTINE X 2)
Special Requests: ADEQUATE
Special Requests: ADEQUATE

## 2022-12-20 LAB — LACTIC ACID, PLASMA: Lactic Acid, Venous: 1.4 mmol/L (ref 0.5–1.9)

## 2022-12-20 LAB — MAGNESIUM: Magnesium: 2 mg/dL (ref 1.7–2.4)

## 2022-12-20 LAB — GLUCOSE, CAPILLARY
Glucose-Capillary: 229 mg/dL — ABNORMAL HIGH (ref 70–99)
Glucose-Capillary: 281 mg/dL — ABNORMAL HIGH (ref 70–99)
Glucose-Capillary: 226 mg/dL — ABNORMAL HIGH (ref 70–99)
Glucose-Capillary: 361 mg/dL — ABNORMAL HIGH (ref 70–99)

## 2022-12-20 LAB — HEMOGLOBIN A1C
Hgb A1c MFr Bld: 14.3 % — ABNORMAL HIGH (ref 4.8–5.6)
Mean Plasma Glucose: 363.71 mg/dL

## 2022-12-20 LAB — PHOSPHORUS: Phosphorus: 3.3 mg/dL (ref 2.5–4.6)

## 2022-12-20 LAB — HIV ANTIBODY (ROUTINE TESTING W REFLEX): HIV Screen 4th Generation wRfx: NONREACTIVE

## 2022-12-20 LAB — APTT: aPTT: 29 seconds (ref 24–36)

## 2022-12-20 MED ORDER — INSULIN GLARGINE-YFGN 100 UNIT/ML ~~LOC~~ SOLN
10.0000 [IU] | Freq: Every day | SUBCUTANEOUS | Status: DC
  Administered 2022-12-20: 10 [IU] via SUBCUTANEOUS
  Filled 2022-12-20 (×2): qty 0.1

## 2022-12-20 MED ORDER — HYDROCHLOROTHIAZIDE 12.5 MG PO TABS
12.5000 mg | ORAL_TABLET | Freq: Every day | ORAL | Status: DC
  Administered 2022-12-20 – 2022-12-22 (×3): 12.5 mg via ORAL
  Filled 2022-12-20 (×3): qty 1

## 2022-12-20 MED ORDER — ACETAMINOPHEN 650 MG RE SUPP: 650.0000 mg | Freq: Four times a day (QID) | RECTAL | Status: DC | PRN

## 2022-12-20 MED ORDER — ONDANSETRON HCL 4 MG PO TABS: 4.0000 mg | ORAL_TABLET | Freq: Four times a day (QID) | ORAL | Status: DC | PRN

## 2022-12-20 MED ORDER — INSULIN ASPART 100 UNIT/ML IJ SOLN
0.0000 [IU] | Freq: Three times a day (TID) | INTRAMUSCULAR | Status: DC
  Administered 2022-12-20 (×2): 5 [IU] via SUBCUTANEOUS
  Administered 2022-12-20 – 2022-12-22 (×5): 8 [IU] via SUBCUTANEOUS

## 2022-12-20 MED ORDER — PENICILLIN G POTASSIUM 20000000 UNITS IJ SOLR
12.0000 10*6.[IU] | Freq: Two times a day (BID) | INTRAVENOUS | Status: DC
  Administered 2022-12-20 – 2022-12-21 (×4): 12 10*6.[IU] via INTRAVENOUS
  Filled 2022-12-20 (×2): qty 12
  Filled 2022-12-20 (×2): qty 5
  Filled 2022-12-20 (×3): qty 12

## 2022-12-20 MED ORDER — ACETAMINOPHEN 325 MG PO TABS: 650.0000 mg | ORAL_TABLET | Freq: Four times a day (QID) | ORAL | Status: DC | PRN

## 2022-12-20 MED ORDER — PENICILLIN G POTASSIUM 20000000 UNITS IJ SOLR
4.0000 10*6.[IU] | INTRAVENOUS | Status: DC
  Filled 2022-12-20 (×7): qty 4

## 2022-12-20 MED ORDER — PENICILLIN G POTASSIUM 20000000 UNITS IJ SOLR
12.0000 10*6.[IU] | Freq: Two times a day (BID) | INTRAVENOUS | Status: DC
  Filled 2022-12-20 (×3): qty 12

## 2022-12-20 MED ORDER — ATORVASTATIN CALCIUM 40 MG PO TABS
40.0000 mg | ORAL_TABLET | Freq: Every day | ORAL | Status: DC
  Administered 2022-12-20 – 2022-12-22 (×3): 40 mg via ORAL
  Filled 2022-12-20 (×3): qty 1

## 2022-12-20 MED ORDER — LISINOPRIL-HYDROCHLOROTHIAZIDE 20-12.5 MG PO TABS: 1.0000 | ORAL_TABLET | Freq: Every day | ORAL | Status: DC

## 2022-12-20 MED ORDER — LISINOPRIL 10 MG PO TABS
20.0000 mg | ORAL_TABLET | Freq: Every day | ORAL | Status: DC
  Administered 2022-12-20 – 2022-12-22 (×3): 20 mg via ORAL
  Filled 2022-12-20 (×3): qty 2

## 2022-12-20 MED ORDER — GLUCERNA SHAKE PO LIQD
237.0000 mL | Freq: Three times a day (TID) | ORAL | Status: DC
  Administered 2022-12-20 – 2022-12-22 (×6): 237 mL via ORAL

## 2022-12-20 MED ORDER — INSULIN ASPART 100 UNIT/ML IJ SOLN
0.0000 [IU] | Freq: Every day | INTRAMUSCULAR | Status: DC
  Administered 2022-12-20: 5 [IU] via SUBCUTANEOUS
  Administered 2022-12-21: 3 [IU] via SUBCUTANEOUS

## 2022-12-20 MED ORDER — ENOXAPARIN SODIUM 40 MG/0.4ML IJ SOSY
40.0000 mg | PREFILLED_SYRINGE | INTRAMUSCULAR | Status: DC
  Administered 2022-12-20 – 2022-12-22 (×3): 40 mg via SUBCUTANEOUS
  Filled 2022-12-20 (×3): qty 0.4

## 2022-12-20 MED ORDER — VANCOMYCIN HCL 2000 MG/400ML IV SOLN
2000.0000 mg | Freq: Once | INTRAVENOUS | Status: AC
  Administered 2022-12-20: 2000 mg via INTRAVENOUS
  Filled 2022-12-20: qty 400

## 2022-12-20 MED ORDER — ONDANSETRON HCL 4 MG/2ML IJ SOLN: 4.0000 mg | Freq: Four times a day (QID) | INTRAMUSCULAR | Status: DC | PRN

## 2022-12-20 NOTE — H&P (Signed)
History and Physical    Patient: Jonathan Sellers DOB: 12-May-1971 DOA: 12/20/2022 DOS: the patient was seen and examined on 12/20/2022 PCP: Benetta Spar, MD  Patient coming from: Home  Chief Complaint:  Chief Complaint  Patient presents with   Positive Blood Cultures   HPI: Jonathan Sellers is a 52 y.o. male with medical history significant of T2DM, hypertension, hyperlipidemia, morbid obesity who presents to the emergency department due to being called to return to the ED due to positive blood cultures. Patient went to see his nutritionist yesterday, he states that his heart rate was fast at the nutritionist's office and was asked to go to the ED for further evaluation and management.  Patient presented to the ED yesterday (5/1) and presented with bilateral lower extremity edema with bilateral erythema (worse on the left), he was empirically started on antibiotics and was discharged home.  Patient endorsed improvement in the redness and discomfort in the left leg compared to yesterday.  Blood cultures drawn came back positive for all 4 bottles per ED physician.  ED Course:  In the emergency department, patient was tachycardic, BP was 180/103, other vital signs were within normal range.  Workup in the ED showed normal CBC except WBC of 10.8, BMP was normal except for blood glucose of 369, albumin 3.3, magnesium 2.0, phosphorus 3.3, lactic acid 1.4.  Blood culture pending. Left lower extremity venous Doppler done on 12/19/2022 showed no evidence of left lower extremity DVT, but showed subcutaneous edema. Chest x-ray showed cardiomegaly with central vascular prominence and mild basilar interstitial edema. Trace pleural effusions. Patient was started on IV vancomycin.  Hospitalist was asked to admit patient for further evaluation and management.  Review of Systems: Review of systems as noted in the HPI. All other systems reviewed and are negative.   Past Medical History:   Diagnosis Date   Diabetes (HCC)    HLD (hyperlipidemia)    HTN (hypertension)    Sleep apnea    Past Surgical History:  Procedure Laterality Date   COLONOSCOPY WITH PROPOFOL N/A 07/12/2020   Procedure: COLONOSCOPY WITH PROPOFOL;  Surgeon: Lanelle Bal, DO;  Location: [facility];  Service: Endoscopy;  Laterality: N/A;  8:00am   POLYPECTOMY  07/12/2020   Procedure: POLYPECTOMY;  Surgeon: Lanelle Bal, DO;  Location: [facility];  Service: Endoscopy;;   WISDOM TOOTH EXTRACTION      Social History:  reports that he has never smoked. He has never used smokeless tobacco. He reports that he does not drink alcohol and does not use drugs.   No Known Allergies  Family History  Problem Relation Age of Onset   Colon cancer Neg Hx      Prior to Admission medications   Medication Sig Start Date End Date Taking? Authorizing Provider  acetaminophen (TYLENOL) 500 MG tablet Take 1,000 mg by mouth 2 (two) times daily as needed for moderate pain or headache.    [provider]  ASPIRIN LOW DOSE 81 MG tablet Take 81 mg by mouth daily. 07/29/22   [provider]  atorvastatin (LIPITOR) 20 MG tablet Take 40 mg by mouth daily.    [provider]  cephALEXin (KEFLEX) 500 MG capsule Take 1 capsule (500 mg total) by mouth 4 (four) times daily. 12/19/22   Terrilee Files, MD  glipiZIDE (GLUCOTROL) 10 MG tablet Take 10 mg by mouth 2 (two) times daily before a meal. 10/19/22   [provider]  Nelly Laurence  100 UNIT/ML Solostar Pen Inject 60 Units into the skin at bedtime. 11/07/22   [provider]  lisinopril-hydrochlorothiazide (ZESTORETIC) 20-12.5 MG tablet Take 1 tablet by mouth 2 (two) times daily.    [provider]  metFORMIN (GLUCOPHAGE) 500 MG tablet Take 500 mg by mouth 2 (two) times daily. 10/19/22   [provider]  OVER THE COUNTER MEDICATION Apply 1 application topically daily as needed (pain). CBD cream    [provider]  potassium chloride (KLOR-CON) 10 MEQ tablet Take 10 mEq by mouth daily. 10/03/22   [provider]  sodium chloride (OCEAN) 0.65 % SOLN nasal spray Place 1 spray into both nostrils as needed for congestion.    [provider]  torsemide (DEMADEX) 20 MG tablet Take 20 mg by mouth daily. 07/08/22   [provider]    Physical Exam: BP (!) 163/94 (BP Location: Left Arm)   Pulse 97   Temp 98.4 F (36.9 C) (Oral)   Resp 20   Ht 5\' 7"  (1.702 m)   Wt (!) 139.2 kg   SpO2 96%   BMI 48.07 kg/m   General: 52 y.o. year-old male well developed well nourished in no acute distress.  Alert and oriented x3. HEENT: NCAT, EOMI Neck: Supple, trachea medial Cardiovascular: Regular rate and rhythm with no rubs or gallops.  No thyromegaly or JVD noted.  Bilateral nonpitting lower extremity edema. 2/4 pulses in all 4 extremities. Respiratory: Clear to auscultation with no wheezes or rales. Good inspiratory effort. Abdomen: Soft, nontender nondistended with normal bowel sounds x4 quadrants. Muskuloskeletal: Minimal erythema with warmth on LLE.  No cyanosis Neuro: CN II-XII intact, strength 5/5 x 4, sensation, reflexes intact Skin: No ulcerative lesions noted or rashes Psychiatry: Judgement and insight appear normal. Mood is appropriate for condition and setting          Labs on Admission:  Basic Metabolic Panel: Recent Labs  Lab 12/19/22 1101 12/20/22 0133  NA 137 135  K 3.7 4.4  CL 102 103  CO2 26 22  GLUCOSE 333* 369*  BUN 16 15  CREATININE 0.88 0.94  CALCIUM 9.8 9.6  MG  --  2.0  PHOS  --  3.3   Liver Function Tests: Recent Labs  Lab 12/20/22 0133  AST 25  ALT 45*  ALKPHOS 122  BILITOT 0.8  PROT 6.7  ALBUMIN 3.3*   No results for input(s): "LIPASE", "AMYLASE" in the last 168 hours. No results for input(s): "AMMONIA" in the last 168 hours. CBC: Recent Labs  Lab 12/19/22 1101 12/20/22 0142  WBC 14.0* 10.8*  NEUTROABS 12.3* 9.7*   HGB 14.7 15.1  HCT 45.1 46.3  MCV 98.5 97.7  PLT 230 222   Cardiac Enzymes: No results for input(s): "CKTOTAL", "CKMB", "CKMBINDEX", "TROPONINI" in the last 168 hours.  BNP (last 3 results) Recent Labs    12/19/22 1101  BNP 27.0    ProBNP (last 3 results) No results for input(s): "PROBNP" in the last 8760 hours.  CBG: No results for input(s): "GLUCAP" in the last 168 hours.  Radiological Exams on Admission: DG Chest Port 1 View  Result Date: 12/20/2022 CLINICAL DATA:  Questionable sepsis.  Positive blood cultures. EXAM: PORTABLE CHEST 1 VIEW COMPARISON:  Portable chest yesterday at 11:15 a.m. FINDINGS: 1:32 a.m. The heart is moderately enlarged. Again noted are central vascular prominence, mild basilar interstitial consolidation suggesting mild interstitial edema, and trace pleural effusions. No focal alveolar infiltrate is seen. The mediastinum is normally outlined.  Thoracic cage is intact. There is overlying monitor wiring. Compare: Unchanged. IMPRESSION: Cardiomegaly with central vascular prominence and mild basilar interstitial edema. Trace pleural effusions. No significant change since yesterday's study. Electronically Signed   By: Almira Bar M.D.   On: 12/20/2022 01:58   US Venous Img Lower  Left (DVT Study)  Result Date: 12/19/2022 CLINICAL DATA:  Leg pain EXAM: LEFT LOWER EXTREMITY VENOUS DOPPLER ULTRASOUND TECHNIQUE: Gray-scale sonography with compression, as well as color and duplex ultrasound, were performed to evaluate the deep venous system(s) from the level of the common femoral vein through the popliteal and proximal calf veins. COMPARISON:  None FINDINGS: VENOUS Normal compressibility of the common femoral, superficial femoral, and popliteal veins, as well as the visualized calf veins. Visualized portions of profunda femoral vein and great saphenous vein unremarkable. No filling defects to suggest DVT on grayscale or color Doppler imaging. Doppler waveforms show normal  direction of venous flow, normal respiratory plasticity and response to augmentation. Limited views of the contralateral common femoral vein are unremarkable. OTHER Subcutaneous edema Limitations: none IMPRESSION: 1. No evidence of LEFT lower extremity deep venous thrombosis. 2. Subcutaneous edema noted. Electronically Signed   By: Genevive Bi M.D.   On: 12/19/2022 12:05   DG Chest Port 1 View  Result Date: 12/19/2022 CLINICAL DATA:  Provided history: Leg pain. EXAM: PORTABLE CHEST 1 VIEW COMPARISON:  No pertinent prior exams available for comparison. FINDINGS: Cardiomegaly. Central pulmonary vascular congestion and suspected mild interstitial pulmonary edema. No appreciable airspace consolidation. No evidence of pleural effusion or pneumothorax. No acute osseous abnormality identified. IMPRESSION: 1. Cardiomegaly. 2. Central pulmonary vascular congestion and suspected mild interstitial pulmonary edema. Electronically Signed   By: Jackey Loge D.O.   On: 12/19/2022 11:24    EKG: I independently viewed the EKG done and my findings are as followed: Sinus tachycardia at a rate of 106 bpm  Assessment/Plan Present on Admission:  Cellulitis of left lower extremity  Morbid obesity (HCC)  Mixed hyperlipidemia  Essential hypertension, benign  Principal Problem:   Cellulitis of left lower extremity Active Problems:   Essential hypertension, benign   Mixed hyperlipidemia   Morbid obesity (HCC)   Hypoalbuminemia due to protein-calorie malnutrition (HCC)   Type 2 diabetes mellitus with hyperglycemia (HCC)  Presumed cellulitis of left lower extremity Reported group B streptococcus bacteremia Continue IV penicillin G  Hypoalbuminemia possibly secondary to mild protein calorie malnutrition Albumin 3.3, protein supplement will be provided  Type 2 diabetes mellitus with hyperglycemia Continue Semglee 10 units and adjust dose accordingly Continue ISS and hypoglycemic protocol  Essential  hypertension Continue Zestoretic  Mixed hyperlipidemia Continue Lipitor   DVT prophylaxis: Lovenox   Advance Care Planning: Full code  Consults: None  Family Communication: None at bedside  Severity of Illness: The appropriate patient status for this patient is INPATIENT. Inpatient status is judged to be reasonable and necessary in order to provide the required intensity of service to ensure the patient's safety. The patient's presenting symptoms, physical exam findings, and initial radiographic and laboratory data in the context of their chronic comorbidities is felt to place them at high risk for further clinical deterioration. Furthermore, it is not anticipated that the patient will be medically stable for discharge from the hospital within 2 midnights of admission.   * I certify that at the point of admission it is my clinical judgment that the patient will require inpatient hospital care spanning beyond 2 midnights from the point of admission due to high intensity  of service, high risk for further deterioration and high frequency of surveillance required.*  Author: Frankey Shown, DO 12/20/2022 6:05 AM  For on call review [email or web address].

## 2022-12-20 NOTE — Consult Note (Signed)
[facility]    Date of Admission:  12/20/2022     Reason for Consult: groub b strep bacteremia/cellulitis    Referring Provider: Sherryll Burger     Lines:  Peripheral iv's  Abx: 5/02-c pcn g       5/1 cephalexin  Assessment: 52 yo male with dm2, obesity, admitted to [place] in recall 5/2 for 5/1 bcx group b strep in setting of dm2 and LLE cellulitis  Patient mild leukocytosis but no fever on 5/1 initial evaluation, sent out on cephalexin but recalled subsequently   Per chart appears to have ?chronic bilateral LE edema with new sx of cellulitis LLE of acute onset same day  5/01 bcx gbs 2 of 2 sets 5/02 repeat bcx in progress  Duration of disease and severity suspect no complication of seeding. However would be prudent to look for metastatic focus of seeding complication    Plan: F/u repeat bcx. If positive would pursue IE workup  Evaluate for new focal pain in back/joint or any area of hardware presence If continued fever despite negative blood cx would try to look for occult abscess such as intra-abdominal Continue penicillin g If bcx clears within 72 hours, and continue to clinically improve (no sign of metastatic focus), would plan 10 day of treatment and can transition to amoxicillin high dose to finish course of treatment Discussed with primary team   Discussed with primary team  I spent more than 15 minute reviewing data/chart, and coordinating care, providing direct face to face time providing diagnostics/treatment plan with patient and treatment team   ------------------------------------------------ Principal Problem:   Cellulitis of left lower extremity Active Problems:   Essential hypertension, benign   Mixed hyperlipidemia   Morbid obesity (HCC)   Hypoalbuminemia due to protein-calorie malnutrition (HCC)   Type 2 diabetes mellitus with hyperglycemia (HCC)    HPI: Jonathan Sellers is a 52 y.o. male with dm2, obesity,  admitted to [place] in recall 5/2 for 5/1 bcx group b strep in setting of dm2 and LLE cellulitis  Patient mild leukocytosis but no fever on 5/1 initial evaluation, sent out on cephalexin but recalled subsequently   Per chart appears to have ?chronic bilateral LE edema with new sx of cellulitis LLE of acute onset same day  5/01 bcx gbs 2 of 2 sets 5/02 repeat bcx in progress  Reviewed chart Afebrile on presentation; mild leukocytosis So far no chart report focal pain outside of left LE Pcn g started -- appears to have received 1 dose vanc in ED  Family History  Problem Relation Age of Onset   Colon cancer Neg Hx     Social History   Tobacco Use   Smoking status: Never   Smokeless tobacco: Never  Vaping Use   Vaping Use: Never used  Substance Use Topics   Alcohol use: No   Drug use: No    No Known Allergies  Review of Systems: ROS All Other ROS was negative, except mentioned above   Past Medical History:  Diagnosis Date   Diabetes (HCC)    HLD (hyperlipidemia)    HTN (hypertension)    Sleep apnea        Scheduled Meds:  atorvastatin  40 mg Oral Daily   enoxaparin (LOVENOX) injection  40 mg Subcutaneous Q24H   feeding supplement (GLUCERNA SHAKE)  237 mL Oral TID BM   lisinopril  20 mg Oral Daily   And  hydrochlorothiazide  12.5 mg Oral Daily   insulin aspart  0-15 Units Subcutaneous TID WC   insulin aspart  0-5 Units Subcutaneous QHS   insulin glargine-yfgn  10 Units Subcutaneous QHS   Continuous Infusions:  penicillin G potassium 12 Million Units in dextrose 5 % 500 mL CONTINUOUS infusion     PRN Meds:.acetaminophen **OR** acetaminophen, ondansetron **OR** ondansetron (ZOFRAN) IV   OBJECTIVE: Blood pressure (!) 163/94, pulse 97, temperature 98.4 F (36.9 C), temperature source Oral, resp. rate 20, height 5\' 7"  (1.702 m), weight (!) 139.2 kg, SpO2 96 %.  Physical Exam  reviewed  Lab Results Lab Results  Component Value Date   WBC 10.8 (H)  12/20/2022   HGB 15.1 12/20/2022   HCT 46.3 12/20/2022   MCV 97.7 12/20/2022   PLT 222 12/20/2022    Lab Results  Component Value Date   CREATININE 0.94 12/20/2022   BUN 15 12/20/2022   NA 135 12/20/2022   K 4.4 12/20/2022   CL 103 12/20/2022   CO2 22 12/20/2022    Lab Results  Component Value Date   ALT 45 (H) 12/20/2022   AST 25 12/20/2022   ALKPHOS 122 12/20/2022   BILITOT 0.8 12/20/2022      Microbiology: Recent Results (from the past 240 hour(s))  Culture, blood (routine x 2)     Status: None (Preliminary result)   Collection Time: 12/19/22 11:06 AM   Specimen: BLOOD  Result Value Ref Range Status   Specimen Description   Final    BLOOD RIGHT ASSIST CONTROL Performed at [facility], [address], [place], [place] [address]    Special Requests   Final    BOTTLES DRAWN AEROBIC AND ANAEROBIC Blood Culture adequate volume Performed at [facility], [address], [place], [place] [address]    Culture  Setup Time   Final    GRAM POSITIVE COCCI IN BOTH AEROBIC AND ANAEROBIC BOTTLES CRITICAL RESULT CALLED TO, READ BACK BY AND VERIFIED WITH: TONI WALKER AT 2336 12/19/2022 BY T KENNEDY CRITICAL VALUE NOTED.  VALUE IS CONSISTENT WITH PREVIOUSLY REPORTED AND CALLED VALUE. Performed at [facility], [address] [place], [place] [address]    Culture GRAM POSITIVE COCCI  Final   Report Status PENDING  Incomplete  Culture, blood (routine x 2)     Status: None (Preliminary result)   Collection Time: 12/19/22 11:13 AM   Specimen: Right Antecubital; Blood  Result Value Ref Range Status   Specimen Description   Final    RIGHT ANTECUBITAL Performed at [facility], [address], [place], [place] [address]    Special Requests   Final    BOTTLES DRAWN AEROBIC AND ANAEROBIC Blood Culture adequate volume Performed at [facility], [address] [place], [place] [address]    Culture  Setup Time   Final    GRAM POSITIVE COCCI IN BOTH AEROBIC AND ANAEROBIC  BOTTLES CRITICAL RESULT CALLED TO, READ BACK BY AND VERIFIED WITH: TONI WALKER AT 2336 12/19/2022 BY T KENNEDY CRITICAL RESULT CALLED TO, READ BACK BY AND VERIFIED WITH: Gean Quint RN 12/20/22 @ 0550 BY AB Performed at [facility], [address], [place], [place] [address]    Culture [facility] POSITIVE COCCI  Final   Report Status PENDING  Incomplete  Blood Culture ID Panel (Reflexed)     Status: Abnormal   Collection Time: 12/19/22 11:13 AM  Result Value Ref Range Status   Enterococcus faecalis NOT DETECTED NOT DETECTED Final  Enterococcus Faecium NOT DETECTED NOT DETECTED Final   Listeria monocytogenes NOT DETECTED NOT DETECTED Final   Staphylococcus species NOT DETECTED NOT DETECTED Final   Staphylococcus aureus (BCID) NOT DETECTED NOT DETECTED Final   Staphylococcus epidermidis NOT DETECTED NOT DETECTED Final   Staphylococcus lugdunensis NOT DETECTED NOT DETECTED Final   Streptococcus species DETECTED (A) NOT DETECTED Final    Comment: CRITICAL RESULT CALLED TO, READ BACK BY AND VERIFIED WITH: BLaural Benes RN 12/20/22 @ 0550 BY AB    Streptococcus agalactiae DETECTED (A) NOT DETECTED Final    Comment: CRITICAL RESULT CALLED TO, READ BACK BY AND VERIFIED WITH: BLaural Benes RN 12/20/22 @ 0550 BY AB    Streptococcus pneumoniae NOT DETECTED NOT DETECTED Final   Streptococcus pyogenes NOT DETECTED NOT DETECTED Final   A.calcoaceticus-baumannii NOT DETECTED NOT DETECTED Final   Bacteroides fragilis NOT DETECTED NOT DETECTED Final   Enterobacterales NOT DETECTED NOT DETECTED Final   Enterobacter cloacae complex NOT DETECTED NOT DETECTED Final   Escherichia coli NOT DETECTED NOT DETECTED Final   Klebsiella aerogenes NOT DETECTED NOT DETECTED Final   Klebsiella oxytoca NOT DETECTED NOT DETECTED Final   Klebsiella pneumoniae NOT DETECTED NOT DETECTED Final   Proteus species NOT DETECTED NOT DETECTED Final   Salmonella species NOT DETECTED NOT DETECTED Final   Serratia marcescens NOT  DETECTED NOT DETECTED Final   Haemophilus influenzae NOT DETECTED NOT DETECTED Final   Neisseria meningitidis NOT DETECTED NOT DETECTED Final   Pseudomonas aeruginosa NOT DETECTED NOT DETECTED Final   Stenotrophomonas maltophilia NOT DETECTED NOT DETECTED Final   Candida albicans NOT DETECTED NOT DETECTED Final   Candida auris NOT DETECTED NOT DETECTED Final   Candida glabrata NOT DETECTED NOT DETECTED Final   Candida krusei NOT DETECTED NOT DETECTED Final   Candida parapsilosis NOT DETECTED NOT DETECTED Final   Candida tropicalis NOT DETECTED NOT DETECTED Final   Cryptococcus neoformans/gattii NOT DETECTED NOT DETECTED Final    Comment: Performed at [facility], [address] [place], [place] [address]  Blood Culture (routine x 2)     Status: None (Preliminary result)   Collection Time: 12/20/22  1:33 AM   Specimen: BLOOD  Result Value Ref Range Status   Specimen Description BLOOD BLOOD RIGHT ARM  Final   Special Requests   Final    BOTTLES DRAWN AEROBIC AND ANAEROBIC Blood Culture adequate volume   Culture   Final    NO GROWTH < 12 HOURS Performed at [facility], [address], [place], [place] [address]    Report Status PENDING  Incomplete  Blood Culture (routine x 2)     Status: None (Preliminary result)   Collection Time: 12/20/22  1:50 AM   Specimen: Right Antecubital; Blood  Result Value Ref Range Status   Specimen Description RIGHT ANTECUBITAL  Final   Special Requests   Final    BOTTLES DRAWN AEROBIC AND ANAEROBIC Blood Culture results may not be optimal due to an excessive volume of blood received in culture bottles   Culture   Final    NO GROWTH < 12 HOURS Performed at [facility], [address], [place], [place] [address]    Report Status PENDING  Incomplete     Serology:    Imaging: If present, new imagings (plain films, ct scans, and mri) have been personally visualized and interpreted; radiology reports have been reviewed. Decision making  incorporated into the Impression / Recommendations.  5/2 cxr Cardiomegaly with central vascular prominence and  mild basilar interstitial edema. Trace pleural effusions. No significant change since yesterday's study.  5/1 left le duplex 1. No evidence of LEFT lower extremity deep venous thrombosis. 2. Subcutaneous edema noted.   Raymondo Band, MD [facility] [phone] pager    12/20/2022, 9:15 AM

## 2022-12-20 NOTE — ED Provider Notes (Signed)
[place] EMERGENCY DEPARTMENT AT [facility] Provider Note   CSN: [id number] Arrival date & time: 12/20/22  0043     History  Chief Complaint  Patient presents with   Positive Blood Cultures    Jonathan Sellers is a 52 y.o. male.  52 year old male that returns after having positive blood cultures.  Patient was here yesterday for leg swelling.  Per the previous note it was swollen and mildly erythematous bilaterally worse on the left.  Prophylactically started on antibiotics.  Patient states he took the antibiotics at home and no other significant changes since then.  His blood cultures came back positive on all 4 bottles.        Home Medications Prior to Admission medications   Medication Sig Start Date End Date Taking? Authorizing Provider  acetaminophen (TYLENOL) 500 MG tablet Take 1,000 mg by mouth 2 (two) times daily as needed for moderate pain or headache.    [provider]  ASPIRIN LOW DOSE 81 MG tablet Take 81 mg by mouth daily. 07/29/22   [provider]  atorvastatin (LIPITOR) 20 MG tablet Take 40 mg by mouth daily.    [provider]  cephALEXin (KEFLEX) 500 MG capsule Take 1 capsule (500 mg total) by mouth 4 (four) times daily. 12/19/22   Terrilee Files, MD  glipiZIDE (GLUCOTROL) 10 MG tablet Take 10 mg by mouth 2 (two) times daily before a meal. 10/19/22   [provider]  LANTUS SOLOSTAR 100 UNIT/ML Solostar Pen Inject 60 Units into the skin at bedtime. 11/07/22   [provider]  lisinopril-hydrochlorothiazide (ZESTORETIC) 20-12.5 MG tablet Take 1 tablet by mouth 2 (two) times daily.    [provider]  metFORMIN (GLUCOPHAGE) 500 MG tablet Take 500 mg by mouth 2 (two) times daily. 10/19/22   [provider]  OVER THE COUNTER MEDICATION Apply 1 application topically daily as needed (pain). CBD cream    [provider]  potassium chloride (KLOR-CON) 10 MEQ tablet Take 10 mEq by mouth daily.  10/03/22   [provider]  sodium chloride (OCEAN) 0.65 % SOLN nasal spray Place 1 spray into both nostrils as needed for congestion.    [provider]  torsemide (DEMADEX) 20 MG tablet Take 20 mg by mouth daily. 07/08/22   [provider]      Allergies    Patient has no known allergies.    Review of Systems   Review of Systems  Physical Exam Updated Vital Signs BP (!) 163/94 (BP Location: Left Arm)   Pulse 97   Temp 98.4 F (36.9 C) (Oral)   Resp 20   Ht 5\' 7"  (1.702 m)   Wt (!) 139.2 kg   SpO2 96%   BMI 48.07 kg/m  Physical Exam Vitals and nursing note reviewed.  Constitutional:      Appearance: He is well-developed.  HENT:     Head: Normocephalic and atraumatic.  Eyes:     Pupils: Pupils are equal, round, and reactive to light.  Cardiovascular:     Rate and Rhythm: Normal rate.  Pulmonary:     Effort: Pulmonary effort is normal. No respiratory distress.  Abdominal:     General: There is no distension.  Musculoskeletal:        General: Normal range of motion.     Cervical back: Normal range of motion.     Right lower leg: Edema present.     Left lower leg: Edema present.  Skin:  General: Skin is warm and dry.     Findings: Erythema (Left lower extremity.  Also warm to touch.) present.  Neurological:     General: No focal deficit present.     Mental Status: He is alert.     ED Results / Procedures / Treatments   Labs (all labs ordered are listed, but only abnormal results are displayed) Labs Reviewed  COMPREHENSIVE METABOLIC PANEL - Abnormal; Notable for the following components:      Result Value   Glucose, Bld 369 (*)    Albumin 3.3 (*)    ALT 45 (*)    All other components within normal limits  URINALYSIS, W/ REFLEX TO CULTURE (INFECTION SUSPECTED) - Abnormal; Notable for the following components:   Color, Urine STRAW (*)    Specific Gravity, Urine 1.033 (*)    Glucose, UA >=500 (*)    Ketones, ur 20 (*)    Protein,  ur 100 (*)    All other components within normal limits  CBC WITH DIFFERENTIAL/PLATELET - Abnormal; Notable for the following components:   WBC 10.8 (*)    Neutro Abs 9.7 (*)    All other components within normal limits  CULTURE, BLOOD (ROUTINE X 2)  CULTURE, BLOOD (ROUTINE X 2)  LACTIC ACID, PLASMA  PROTIME-INR  APTT  MAGNESIUM  PHOSPHORUS  CBC WITH DIFFERENTIAL/PLATELET  HIV ANTIBODY (ROUTINE TESTING W REFLEX)  HEMOGLOBIN A1C    EKG None  Radiology DG Chest Port 1 View  Result Date: 12/20/2022 CLINICAL DATA:  Questionable sepsis.  Positive blood cultures. EXAM: PORTABLE CHEST 1 VIEW COMPARISON:  Portable chest yesterday at 11:15 a.m. FINDINGS: 1:32 a.m. The heart is moderately enlarged. Again noted are central vascular prominence, mild basilar interstitial consolidation suggesting mild interstitial edema, and trace pleural effusions. No focal alveolar infiltrate is seen. The mediastinum is normally outlined. Thoracic cage is intact. There is overlying monitor wiring. Compare: Unchanged. IMPRESSION: Cardiomegaly with central vascular prominence and mild basilar interstitial edema. Trace pleural effusions. No significant change since yesterday's study. Electronically Signed   By: Almira Bar M.D.   On: 12/20/2022 01:58   US Venous Img Lower  Left (DVT Study)  Result Date: 12/19/2022 CLINICAL DATA:  Leg pain EXAM: LEFT LOWER EXTREMITY VENOUS DOPPLER ULTRASOUND TECHNIQUE: Gray-scale sonography with compression, as well as color and duplex ultrasound, were performed to evaluate the deep venous system(s) from the level of the common femoral vein through the popliteal and proximal calf veins. COMPARISON:  None FINDINGS: VENOUS Normal compressibility of the common femoral, superficial femoral, and popliteal veins, as well as the visualized calf veins. Visualized portions of profunda femoral vein and great saphenous vein unremarkable. No filling defects to suggest DVT on grayscale or color  Doppler imaging. Doppler waveforms show normal direction of venous flow, normal respiratory plasticity and response to augmentation. Limited views of the contralateral common femoral vein are unremarkable. OTHER Subcutaneous edema Limitations: none IMPRESSION: 1. No evidence of LEFT lower extremity deep venous thrombosis. 2. Subcutaneous edema noted. Electronically Signed   By: Genevive Bi M.D.   On: 12/19/2022 12:05   DG Chest Port 1 View  Result Date: 12/19/2022 CLINICAL DATA:  Provided history: Leg pain. EXAM: PORTABLE CHEST 1 VIEW COMPARISON:  No pertinent prior exams available for comparison. FINDINGS: Cardiomegaly. Central pulmonary vascular congestion and suspected mild interstitial pulmonary edema. No appreciable airspace consolidation. No evidence of pleural effusion or pneumothorax. No acute osseous abnormality identified. IMPRESSION: 1. Cardiomegaly. 2. Central pulmonary vascular congestion and suspected mild  interstitial pulmonary edema. Electronically Signed   By: Jackey Loge D.O.   On: 12/19/2022 11:24    Procedures Procedures    Medications Ordered in ED Medications  enoxaparin (LOVENOX) injection 40 mg (40 mg Subcutaneous Not Given 12/20/22 0405)  acetaminophen (TYLENOL) tablet 650 mg (has no administration in time range)    Or  acetaminophen (TYLENOL) suppository 650 mg (has no administration in time range)  ondansetron (ZOFRAN) tablet 4 mg (has no administration in time range)    Or  ondansetron (ZOFRAN) injection 4 mg (has no administration in time range)  feeding supplement (GLUCERNA SHAKE) (GLUCERNA SHAKE) liquid 237 mL (has no administration in time range)  insulin glargine-yfgn (SEMGLEE) injection 10 Units (has no administration in time range)  insulin aspart (novoLOG) injection 0-15 Units (has no administration in time range)  insulin aspart (novoLOG) injection 0-5 Units (has no administration in time range)  penicillin G potassium 4 Million Units in dextrose 5 % 250  mL IVPB (has no administration in time range)  vancomycin (VANCOREADY) IVPB 2000 mg/400 mL (0 mg Intravenous Stopped 12/20/22 0359)    ED Course/ Medical Decision Making/ A&P                             Medical Decision Making Amount and/or Complexity of Data Reviewed Labs: ordered. Radiology: ordered. ECG/medicine tests: ordered.  Risk Prescription drug management. Decision regarding hospitalization.   Will redraw blood cultures.  He is tachycardic, tachypneic and had a white count yesterday.  Will go and start vancomycin for likely left lower extremity cellulitis.  Plan for admission. WBC better. Appears well. D/w TRH for admission.   Final Clinical Impression(s) / ED Diagnoses Final diagnoses:  Positive blood culture    Rx / DC Orders ED Discharge Orders     None         Zahari Fazzino, Barbara Cower, MD 12/20/22 [phone]

## 2022-12-20 NOTE — Progress Notes (Signed)
PROGRESS NOTE    Jonathan Sellers  ZOX:[id number] DOB: October 30, 1970 DOA: 12/20/2022 PCP: Benetta Spar, MD   Brief Narrative:    Jonathan Sellers is a 52 y.o. male with medical history significant of T2DM, hypertension, hyperlipidemia, morbid obesity who presents to the emergency department due to being called to return to the ED due to positive blood cultures.  He was seen in the ED for left lower extremity cellulitis and discharged on oral Keflex and called back for group be Streptococcus bacteremia and was started on IV penicillin G.  Assessment & Plan:   Principal Problem:   Cellulitis of left lower extremity Active Problems:   Essential hypertension, benign   Mixed hyperlipidemia   Morbid obesity (HCC)   Hypoalbuminemia due to protein-calorie malnutrition (HCC)   Type 2 diabetes mellitus with hyperglycemia (HCC)   Bacteremia  Assessment and Plan:  Presumed cellulitis of left lower extremity Reported group B streptococcus bacteremia Continue IV penicillin G Repeat blood cultures pending, appreciate ID recommendations 2D echocardiogram ordered and pending   Hypoalbuminemia possibly secondary to mild protein calorie malnutrition Albumin 3.3, protein supplement will be provided   Type 2 diabetes mellitus with hyperglycemia Continue Semglee 10 units and adjust dose accordingly Continue ISS and hypoglycemic protocol   Essential hypertension Continue Zestoretic   Mixed hyperlipidemia Continue Lipitor  Morbid obesity BMI 48.07    DVT prophylaxis: Lovenox Code Status: Full Family Communication: None at bedside Disposition Plan:  Status is: Inpatient Remains inpatient appropriate because: Need for IV antibiotics  Consultants:  ID  Procedures:  None  Antimicrobials:  Anti-infectives (From admission, onward)    Start     Dose/Rate Route Frequency Ordered Stop   12/20/22 1200  penicillin G potassium 4 Million Units in dextrose 5 % 250 mL IVPB  Status:   Discontinued        4 Million Units 250 mL/hr over 60 Minutes Intravenous Every 4 hours 12/20/22 0602 12/20/22 0744   12/20/22 1000  penicillin G potassium 12 Million Units in dextrose 5 % 500 mL CONTINUOUS infusion        12 Million Units 41.7 mL/hr over 12 Hours Intravenous Every 12 hours 12/20/22 0745     12/20/22 0845  penicillin G potassium 12 Million Units in dextrose 5 % 500 mL CONTINUOUS infusion  Status:  Discontinued        12 Million Units 41.7 mL/hr over 12 Hours Intravenous Every 12 hours 12/20/22 0759 12/20/22 0801   12/20/22 0115  vancomycin (VANCOREADY) IVPB 2000 mg/400 mL        2,000 mg 200 mL/hr over 120 Minutes Intravenous  Once 12/20/22 0110 12/20/22 0359       Subjective: Patient seen and evaluated today with no new acute complaints or concerns. No acute concerns or events noted overnight.  Objective: Vitals:   12/20/22 0430 12/20/22 0447 12/20/22 0505 12/20/22 0900  BP: (!) 165/110  (!) 163/94 133/71  Pulse: (!) 106  97 (!) 105  Resp: 19  20 14   Temp:  98.1 F (36.7 C) 98.4 F (36.9 C)   TempSrc:  Oral Oral   SpO2: 93%  96% 96%  Weight:  (!) 139.2 kg    Height:  5\' 7"  (1.702 m)      Intake/Output Summary (Last 24 hours) at 12/20/2022 1037 Last data filed at 12/20/2022 0900 Gross per 24 hour  Intake 640 ml  Output 475 ml  Net 165 ml   Filed Weights   12/20/22 0105 12/20/22  0447  Weight: (!) 141 kg (!) 139.2 kg    Examination:  General exam: Appears calm and comfortable, morbidly obese Respiratory system: Clear to auscultation. Respiratory effort normal. Cardiovascular system: S1 & S2 heard, RRR.  Gastrointestinal system: Abdomen is soft Central nervous system: Alert and awake Extremities: Left lower extremity erythema and tenderness Skin: No significant lesions noted Psychiatry: Flat affect.    Data Reviewed: I have personally reviewed following labs and imaging studies  CBC: Recent Labs  Lab 12/19/22 1101 12/20/22 0142  WBC 14.0*  10.8*  NEUTROABS 12.3* 9.7*  HGB 14.7 15.1  HCT 45.1 46.3  MCV 98.5 97.7  PLT 230 222   Basic Metabolic Panel: Recent Labs  Lab 12/19/22 1101 12/20/22 0133  NA 137 135  K 3.7 4.4  CL 102 103  CO2 26 22  GLUCOSE 333* 369*  BUN 16 15  CREATININE 0.88 0.94  CALCIUM 9.8 9.6  MG  --  2.0  PHOS  --  3.3   GFR: Estimated Creatinine Clearance: 122.5 mL/min (by C-G formula based on SCr of 0.94 mg/dL). Liver Function Tests: Recent Labs  Lab 12/20/22 0133  AST 25  ALT 45*  ALKPHOS 122  BILITOT 0.8  PROT 6.7  ALBUMIN 3.3*   No results for input(s): "LIPASE", "AMYLASE" in the last 168 hours. No results for input(s): "AMMONIA" in the last 168 hours. Coagulation Profile: Recent Labs  Lab 12/20/22 0133  INR 1.0   Cardiac Enzymes: No results for input(s): "CKTOTAL", "CKMB", "CKMBINDEX", "TROPONINI" in the last 168 hours. BNP (last 3 results) No results for input(s): "PROBNP" in the last 8760 hours. HbA1C: Recent Labs    12/19/22 0958 12/20/22 0142  HGBA1C 14.0* 14.3*   CBG: Recent Labs  Lab 12/20/22 0740  GLUCAP 229*   Lipid Profile: No results for input(s): "CHOL", "HDL", "LDLCALC", "TRIG", "CHOLHDL", "LDLDIRECT" in the last 72 hours. Thyroid Function Tests: No results for input(s): "TSH", "T4TOTAL", "FREET4", "T3FREE", "THYROIDAB" in the last 72 hours. Anemia Panel: No results for input(s): "VITAMINB12", "FOLATE", "FERRITIN", "TIBC", "IRON", "RETICCTPCT" in the last 72 hours. Sepsis Labs: Recent Labs  Lab 12/19/22 1105 12/20/22 0150  LATICACIDVEN 1.6 1.4    Recent Results (from the past 240 hour(s))  Culture, blood (routine x 2)     Status: None (Preliminary result)   Collection Time: 12/19/22 11:06 AM   Specimen: BLOOD  Result Value Ref Range Status   Specimen Description   Final    BLOOD RIGHT ASSIST CONTROL Performed at [facility], [address] [place], [place] [address]    Special Requests   Final    BOTTLES DRAWN AEROBIC AND ANAEROBIC  Blood Culture adequate volume Performed at [facility], [address], [place], [place] [address]    Culture  Setup Time   Final    GRAM POSITIVE COCCI IN BOTH AEROBIC AND ANAEROBIC BOTTLES CRITICAL RESULT CALLED TO, READ BACK BY AND VERIFIED WITH: TONI WALKER AT 2336 12/19/2022 BY T KENNEDY CRITICAL VALUE NOTED.  VALUE IS CONSISTENT WITH PREVIOUSLY REPORTED AND CALLED VALUE. Performed at [facility], [address] [place], [place] [address]    Culture GRAM POSITIVE COCCI  Final   Report Status PENDING  Incomplete  Culture, blood (routine x 2)     Status: None (Preliminary result)   Collection Time: 12/19/22 11:13 AM   Specimen: Right Antecubital; Blood  Result Value Ref Range Status   Specimen Description   Final    RIGHT ANTECUBITAL Performed at [facility], [address] [place], [place] [address]    Special Requests   Final    BOTTLES DRAWN AEROBIC AND ANAEROBIC Blood Culture adequate volume Performed at [facility], [address] [place], [place] [address]    Culture  Setup Time   Final    GRAM POSITIVE COCCI IN BOTH AEROBIC AND ANAEROBIC BOTTLES CRITICAL RESULT CALLED TO, READ BACK BY AND VERIFIED WITH: TONI WALKER AT 2336 12/19/2022 BY T KENNEDY CRITICAL RESULT CALLED TO, READ BACK BY AND VERIFIED WITH: Gean Quint RN 12/20/22 @ 0550 BY AB Performed at [facility], [address], [place], [place] [address]    Culture GRAM POSITIVE COCCI  Final   Report Status PENDING  Incomplete  Blood Culture ID Panel (Reflexed)     Status: Abnormal   Collection Time: 12/19/22 11:13 AM  Result Value Ref Range Status   Enterococcus faecalis NOT DETECTED NOT DETECTED Final   Enterococcus Faecium NOT DETECTED NOT DETECTED Final   Listeria monocytogenes NOT DETECTED NOT DETECTED Final   Staphylococcus species NOT DETECTED NOT DETECTED Final   Staphylococcus aureus (BCID) NOT DETECTED NOT DETECTED Final   Staphylococcus epidermidis NOT DETECTED NOT DETECTED Final    Staphylococcus lugdunensis NOT DETECTED NOT DETECTED Final   Streptococcus species DETECTED (A) NOT DETECTED Final    Comment: CRITICAL RESULT CALLED TO, READ BACK BY AND VERIFIED WITH: BLaural Benes RN 12/20/22 @ 0550 BY AB    Streptococcus agalactiae DETECTED (A) NOT DETECTED Final    Comment: CRITICAL RESULT CALLED TO, READ BACK BY AND VERIFIED WITH: BLaural Benes RN 12/20/22 @ 0550 BY AB    Streptococcus pneumoniae NOT DETECTED NOT DETECTED Final   Streptococcus pyogenes NOT DETECTED NOT DETECTED Final   A.calcoaceticus-baumannii NOT DETECTED NOT DETECTED Final   Bacteroides fragilis NOT DETECTED NOT DETECTED Final   Enterobacterales NOT DETECTED NOT DETECTED Final   Enterobacter cloacae complex NOT DETECTED NOT DETECTED Final   Escherichia coli NOT DETECTED NOT DETECTED Final   Klebsiella aerogenes NOT DETECTED NOT DETECTED Final   Klebsiella oxytoca NOT DETECTED NOT DETECTED Final   Klebsiella pneumoniae NOT DETECTED NOT DETECTED Final   Proteus species NOT DETECTED NOT DETECTED Final   Salmonella species NOT DETECTED NOT DETECTED Final   Serratia marcescens NOT DETECTED NOT DETECTED Final   Haemophilus influenzae NOT DETECTED NOT DETECTED Final   Neisseria meningitidis NOT DETECTED NOT DETECTED Final   Pseudomonas aeruginosa NOT DETECTED NOT DETECTED Final   Stenotrophomonas maltophilia NOT DETECTED NOT DETECTED Final   Candida albicans NOT DETECTED NOT DETECTED Final   Candida auris NOT DETECTED NOT DETECTED Final   Candida glabrata NOT DETECTED NOT DETECTED Final   Candida krusei NOT DETECTED NOT DETECTED Final   Candida parapsilosis NOT DETECTED NOT DETECTED Final   Candida tropicalis NOT DETECTED NOT DETECTED Final   Cryptococcus neoformans/gattii NOT DETECTED NOT DETECTED Final    Comment: Performed at [facility], [address] [place], [place] [address]  Blood Culture (routine x 2)     Status: None (Preliminary result)   Collection Time: 12/20/22  1:33 AM   Specimen:  BLOOD  Result Value Ref Range Status   Specimen Description BLOOD BLOOD RIGHT ARM  Final   Special Requests   Final    BOTTLES DRAWN AEROBIC AND ANAEROBIC Blood Culture adequate volume   Culture   Final    NO GROWTH < 12 HOURS Performed at [facility], [address] [place], [place] [address]  Report Status PENDING  Incomplete  Blood Culture (routine x 2)     Status: None (Preliminary result)   Collection Time: 12/20/22  1:50 AM   Specimen: Right Antecubital; Blood  Result Value Ref Range Status   Specimen Description RIGHT ANTECUBITAL  Final   Special Requests   Final    BOTTLES DRAWN AEROBIC AND ANAEROBIC Blood Culture results may not be optimal due to an excessive volume of blood received in culture bottles   Culture   Final    NO GROWTH < 12 HOURS Performed at [facility], [address], [place], [place] [address]    Report Status PENDING  Incomplete         Radiology Studies: DG Chest Port 1 View  Result Date: 12/20/2022 CLINICAL DATA:  Questionable sepsis.  Positive blood cultures. EXAM: PORTABLE CHEST 1 VIEW COMPARISON:  Portable chest yesterday at 11:15 a.m. FINDINGS: 1:32 a.m. The heart is moderately enlarged. Again noted are central vascular prominence, mild basilar interstitial consolidation suggesting mild interstitial edema, and trace pleural effusions. No focal alveolar infiltrate is seen. The mediastinum is normally outlined. Thoracic cage is intact. There is overlying monitor wiring. Compare: Unchanged. IMPRESSION: Cardiomegaly with central vascular prominence and mild basilar interstitial edema. Trace pleural effusions. No significant change since yesterday's study. Electronically Signed   By: Almira Bar M.D.   On: 12/20/2022 01:58   US Venous Img Lower  Left (DVT Study)  Result Date: 12/19/2022 CLINICAL DATA:  Leg pain EXAM: LEFT LOWER EXTREMITY VENOUS DOPPLER ULTRASOUND TECHNIQUE: Gray-scale sonography with compression, as well as color and duplex  ultrasound, were performed to evaluate the deep venous system(s) from the level of the common femoral vein through the popliteal and proximal calf veins. COMPARISON:  None FINDINGS: VENOUS Normal compressibility of the common femoral, superficial femoral, and popliteal veins, as well as the visualized calf veins. Visualized portions of profunda femoral vein and great saphenous vein unremarkable. No filling defects to suggest DVT on grayscale or color Doppler imaging. Doppler waveforms show normal direction of venous flow, normal respiratory plasticity and response to augmentation. Limited views of the contralateral common femoral vein are unremarkable. OTHER Subcutaneous edema Limitations: none IMPRESSION: 1. No evidence of LEFT lower extremity deep venous thrombosis. 2. Subcutaneous edema noted. Electronically Signed   By: Genevive Bi M.D.   On: 12/19/2022 12:05   DG Chest Port 1 View  Result Date: 12/19/2022 CLINICAL DATA:  Provided history: Leg pain. EXAM: PORTABLE CHEST 1 VIEW COMPARISON:  No pertinent prior exams available for comparison. FINDINGS: Cardiomegaly. Central pulmonary vascular congestion and suspected mild interstitial pulmonary edema. No appreciable airspace consolidation. No evidence of pleural effusion or pneumothorax. No acute osseous abnormality identified. IMPRESSION: 1. Cardiomegaly. 2. Central pulmonary vascular congestion and suspected mild interstitial pulmonary edema. Electronically Signed   By: Jackey Loge D.O.   On: 12/19/2022 11:24        Scheduled Meds:  atorvastatin  40 mg Oral Daily   enoxaparin (LOVENOX) injection  40 mg Subcutaneous Q24H   feeding supplement (GLUCERNA SHAKE)  237 mL Oral TID BM   lisinopril  20 mg Oral Daily   And   hydrochlorothiazide  12.5 mg Oral Daily   insulin aspart  0-15 Units Subcutaneous TID WC   insulin aspart  0-5 Units Subcutaneous QHS   insulin glargine-yfgn  10 Units Subcutaneous QHS   Continuous Infusions:  penicillin G  potassium 12 Million Units in dextrose 5 % 500 mL CONTINUOUS infusion  LOS: 0 days    Time spent: 35 minutes    Julea Hutto Hoover Brunette, DO [facility]  If 7PM-7AM, please contact night-coverage [email or web address] 12/20/2022, 10:37 AM

## 2022-12-20 NOTE — ED Triage Notes (Signed)
Pt arrives back to ED after receiving phone call about his positive blood cultures from last night.

## 2022-12-20 NOTE — ED Notes (Signed)
Lab and xray at bedside.

## 2022-12-20 NOTE — ED Notes (Signed)
Provider at bedside

## 2022-12-20 NOTE — ED Notes (Signed)
ED Provider at bedside. 

## 2022-12-20 NOTE — Progress Notes (Signed)
  Transition of Care [facility]) Screening Note   Patient Details  Name: Jonathan Sellers Date of Birth: March 21, 1971   Transition of Care [facility]) CM/SW Contact:    Annice Needy, LCSW Phone Number: 12/20/2022, 11:23 AM    Transition of Care Department [facility]) has reviewed patient and no TOC needs have been identified at this time. We will continue to monitor patient advancement through interdisciplinary progression rounds. If new patient transition needs arise, please place a TOC consult.

## 2022-12-20 NOTE — ED Provider Notes (Signed)
Charge nurse reported positive blood cultures. It appears patient was tachycardic, tachypneic and mildly hypoxic with leukocytosis yesterday. Was being treated for possible cellulitis. Advised nursing to call back for reeval and possible admission.    Guadalupe Nickless, Barbara Cower, MD 12/20/22 [phone]

## 2022-12-20 NOTE — Progress Notes (Signed)
  Echocardiogram 2D Echocardiogram has been performed.  Jonathan Sellers 12/20/2022, 2:51 PM

## 2022-12-21 ENCOUNTER — Telehealth (HOSPITAL_COMMUNITY): Payer: Self-pay | Admitting: Pharmacy Technician

## 2022-12-21 ENCOUNTER — Other Ambulatory Visit (HOSPITAL_COMMUNITY): Payer: Self-pay

## 2022-12-21 DIAGNOSIS — L03116 Cellulitis of left lower limb: Secondary | ICD-10-CM | POA: Diagnosis not present

## 2022-12-21 LAB — GLUCOSE, CAPILLARY
Glucose-Capillary: 255 mg/dL — ABNORMAL HIGH (ref 70–99)
Glucose-Capillary: 263 mg/dL — ABNORMAL HIGH (ref 70–99)
Glucose-Capillary: 260 mg/dL — ABNORMAL HIGH (ref 70–99)
Glucose-Capillary: 255 mg/dL — ABNORMAL HIGH (ref 70–99)

## 2022-12-21 LAB — CBC
HCT: 40.9 % (ref 39.0–52.0)
Hemoglobin: 13.4 g/dL (ref 13.0–17.0)
MCH: 31.9 pg (ref 26.0–34.0)
MCHC: 32.8 g/dL (ref 30.0–36.0)
MCV: 97.4 fL (ref 80.0–100.0)
Platelets: 216 10*3/uL (ref 150–400)
RBC: 4.2 MIL/uL — ABNORMAL LOW (ref 4.22–5.81)
RDW: 14.3 % (ref 11.5–15.5)
WBC: 8 10*3/uL (ref 4.0–10.5)
nRBC: 0 % (ref 0.0–0.2)

## 2022-12-21 LAB — COMPREHENSIVE METABOLIC PANEL
ALT: 32 U/L (ref 0–44)
AST: 13 U/L — ABNORMAL LOW (ref 15–41)
Albumin: 2.6 g/dL — ABNORMAL LOW (ref 3.5–5.0)
Alkaline Phosphatase: 85 U/L (ref 38–126)
Anion gap: 8 (ref 5–15)
BUN: 12 mg/dL (ref 6–20)
CO2: 24 mmol/L (ref 22–32)
Calcium: 9.1 mg/dL (ref 8.9–10.3)
Chloride: 102 mmol/L (ref 98–111)
Creatinine, Ser: 0.75 mg/dL (ref 0.61–1.24)
GFR, Estimated: >60 mL/min (ref >60)
Glucose, Bld: 258 mg/dL — ABNORMAL HIGH (ref 70–99)
Potassium: 3.7 mmol/L (ref 3.5–5.1)
Sodium: 134 mmol/L — ABNORMAL LOW (ref 135–145)
Total Bilirubin: 0.7 mg/dL (ref 0.3–1.2)
Total Protein: 5.8 g/dL — ABNORMAL LOW (ref 6.5–8.1)

## 2022-12-21 LAB — CULTURE, BLOOD (ROUTINE X 2)

## 2022-12-21 LAB — MAGNESIUM: Magnesium: 1.9 mg/dL (ref 1.7–2.4)

## 2022-12-21 MED ORDER — INSULIN ASPART 100 UNIT/ML IJ SOLN
5.0000 [IU] | Freq: Three times a day (TID) | INTRAMUSCULAR | Status: DC
  Administered 2022-12-21 – 2022-12-22 (×2): 5 [IU] via SUBCUTANEOUS

## 2022-12-21 MED ORDER — INSULIN GLARGINE-YFGN 100 UNIT/ML ~~LOC~~ SOLN
10.0000 [IU] | Freq: Two times a day (BID) | SUBCUTANEOUS | Status: DC
  Administered 2022-12-21 – 2022-12-22 (×3): 10 [IU] via SUBCUTANEOUS
  Filled 2022-12-21 (×5): qty 0.1

## 2022-12-21 NOTE — Inpatient Diabetes Management (Addendum)
Inpatient Diabetes Program Recommendatinons  AACE/ADA: New Consensus Statement on Inpatient Glycemic Control   Target Ranges:  Prepandial:   less than 140 mg/dL      Peak postprandial:   less than 180 mg/dL (1-2 hours)      Critically ill patients:  140 - 180 mg/dL    Latest Reference Range & Units 12/20/22 07:40 12/20/22 10:53 12/20/22 16:10 12/20/22 21:10 12/21/22 07:22  Glucose-Capillary 70 - 99 mg/dL 161 (H) 096 (H) 045 (H) 361 (H) 255 (H)    Latest Reference Range & Units 12/20/22 01:33 12/20/22 01:42 12/21/22 04:10  Glucose 70 - 99 mg/dL 409 (H)  811 (H)  Hemoglobin A1C 4.8 - 5.6 %  14.3 (H)    Review of Glycemic Control  Diabetes history: DM2 Outpatient Diabetes medications: Lantus 60 units QHS, Glipizide 10 mg BID, Metformin 500 mg BID Current orders for Inpatient glycemic control: Semglee 10 units QHS, Novolog 0-15 units TID with meals, Novolog 0-5 units QHS  Inpatient Diabetes Program Recommendations:    Insulin: Please consider increasing Semglee to 10 units BID and adding Novolog 5 units TID with meals for meal coverage if patient eats at least 50% of meals.  HbgA1C: A1C 14.3% on 12/20/22 indicating an average glucose of 364 mg/dl over the past 2-3 months.  Outpatient DM: At discharge, may want to consider changing Metformin to Metformin XR to help with GI symptoms. Please provide Rx for Dexcom G7 reader (#[id number]) and Dexcom G7 sensors (#[id number]).  Per outpatient [facility] pharmacy, Dexcom G7 Reader is $60.19, Dexcom G7 Sensors are $73.61 (I left message for patient on cell phone voicemail regarding copay cost).   NOTE: Patient admitted with left lower extremity cellulitis. Initial lab glucose 369 mg/dl on 0/8/65. Per chart review, patient seen Dr. Fransico Him (Endocrinologist) on 12/19/22 and A1C was 14% at that time; patient was asked to continue Lantus 60 units QHS, decrease Glipizide to 10 mg QAM and Metformin to 500 mg BID. Will plan to follow up with patient today regarding DM and  A1C.  Addendum 12/21/22@12 :15-Spoke with patient and wife at bedside about diabetes and home regimen for diabetes control. Patient reports he just started seeing Dr. Fransico Him for diabetes management and he seen Dr. Fransico Him on 12/19/22. Patient confirms he is currently taking Lantus 60 units QHS, Glipizide 10 mg BID, and Metformin 500 mg BID as an outpatient for diabetes control. Patient and his wife report patient consistently takes DM medications as prescribed. Patient has 3-4 bowel movements per day due to the Metformin. Discussed that Metformin XR may help decrease GI symptom and tolerated better.  Patient states he uses his abdomen and legs for insulin injections. He notes that he was giving some of his injections right above his knee but he was recently told not to use that area and to instead use the upper thigh area; he was also giving insulin in scar tissue. Reviewed insulin injection sites and encouraged patient to stay away from any scar tissue or hardened areas, and stressed to rotate injection sites. Patient reports he does not like to stick his finger to check glucose and his wife states that she usually has to check it for him. She notes she has diabetes as well and checks her own glucose BID but she is doing good if patient lets her check it once a day. She states that glucose has been running higher over the past several weeks, 300's mg/dl or higher.  Patient notes that he has had frequent urination and excessive  thirst lately (drinking about 2 gallons of water a day). Discussed symptoms of hyperglycemia along with treatment.  Discussed A1C results (14.3% on 12/20/22) and explained that current A1C indicates an average glucose of 364 mg/dl over the past 2-3 months. Discussed glucose and A1C goals. Discussed importance of checking CBGs and maintaining good CBG control to prevent long-term and short-term complications. Explained how hyperglycemia leads to damage within blood vessels which lead to the common  complications seen with uncontrolled diabetes. Stressed to the patient the importance of improving glycemic control to prevent further complications from uncontrolled diabetes. Discussed impact of nutrition, exercise, stress, sickness, and medications on diabetes control.  Discussed carbohydrates, carbohydrate goals per day and meal, along with portion sizes. Inquired about any prior use of continuous glucose monitoring (CGM) sensors and patient has never used a CGM sensor.  Explained how CGM is applied and works, explained that CGM would provide real time data on glucose and glucose response to foods and activity. Also explained how the information could be used by Dr. Fransico Him to adjust DM medications and get DM under better control.  Patient reports he would be interested in using CGM sensors. Patient has android phone but it is not compatible with Dexcom nor FreeStyle Libre3; explained that if he used a CGM sensor he would also need a reader device to read it.  Will ask outpatient pharmacy to see if insurance would cover Dexcom G7 or FreeStyle Libre3.  Encouraged patient to make dietary changes, monitor glucose frequently, and follow up with Dr. Fransico Him as scheduled (patient states he goes back in 10 days).  Patient states he plans to make changes and get his DM under better control.  Patient verbalized understanding of information discussed and reports no further questions at this time related to diabetes.   Thanks, Orlando Penner, RN, MSN, CDCES Diabetes Coordinator Inpatient Diabetes Program [phone] (Team Pager from 8am to 5pm)

## 2022-12-21 NOTE — Progress Notes (Signed)
PROGRESS NOTE    Jonathan Sellers  YQM:[id number] DOB: 14-Dec-1970 DOA: 12/20/2022 PCP: Benetta Spar, MD   Brief Narrative:    Jonathan Sellers is a 52 y.o. male with medical history significant of T2DM, hypertension, hyperlipidemia, morbid obesity who presents to the emergency department due to being called to return to the ED due to positive blood cultures.  He was seen in the ED for left lower extremity cellulitis and discharged on oral Keflex and called back for group be Streptococcus bacteremia and was started on IV penicillin G.  Assessment & Plan:   Principal Problem:   Cellulitis of left lower extremity Active Problems:   Essential hypertension, benign   Mixed hyperlipidemia   Morbid obesity (HCC)   Hypoalbuminemia due to protein-calorie malnutrition (HCC)   Type 2 diabetes mellitus with hyperglycemia (HCC)   Bacteremia  Assessment and Plan:  Presumed cellulitis of left lower extremity Reported group B streptococcus bacteremia Continue IV penicillin G Cultures with no growth to date and anticipate discharge in the next 24 hours if he continues to remain negative, appreciate ID recommendations 2D echocardiogram with preserved LVEF and no signs of vegetations noted   Hypoalbuminemia possibly secondary to mild protein calorie malnutrition Albumin 3.3, protein supplement will be provided   Type 2 diabetes mellitus with hyperglycemia Continue Semglee 10 units and adjust dose accordingly to twice daily dosing Continue ISS and hypoglycemic protocol A1c 14.3%   Essential hypertension Continue Zestoretic   Mixed hyperlipidemia Continue Lipitor  Morbid obesity BMI 48.07    DVT prophylaxis: Lovenox Code Status: Full Family Communication: None at bedside Disposition Plan:  Status is: Inpatient Remains inpatient appropriate because: Need for IV antibiotics  Consultants:  ID  Procedures:  None  Antimicrobials:  Anti-infectives (From admission, onward)     Start     Dose/Rate Route Frequency Ordered Stop   12/20/22 1200  penicillin G potassium 4 Million Units in dextrose 5 % 250 mL IVPB  Status:  Discontinued        4 Million Units 250 mL/hr over 60 Minutes Intravenous Every 4 hours 12/20/22 0602 12/20/22 0744   12/20/22 1000  penicillin G potassium 12 Million Units in dextrose 5 % 500 mL CONTINUOUS infusion        12 Million Units 41.7 mL/hr over 12 Hours Intravenous Every 12 hours 12/20/22 0745     12/20/22 0845  penicillin G potassium 12 Million Units in dextrose 5 % 500 mL CONTINUOUS infusion  Status:  Discontinued        12 Million Units 41.7 mL/hr over 12 Hours Intravenous Every 12 hours 12/20/22 0759 12/20/22 0801   12/20/22 0115  vancomycin (VANCOREADY) IVPB 2000 mg/400 mL        2,000 mg 200 mL/hr over 120 Minutes Intravenous  Once 12/20/22 0110 12/20/22 0359       Subjective: Patient seen and evaluated today with no new acute complaints or concerns. No acute concerns or events noted overnight.  Objective: Vitals:   12/20/22 0900 12/20/22 1100 12/20/22 2112 12/21/22 0500  BP: 133/71 (!) 159/104 138/88 (!) 153/97  Pulse: (!) 105 (!) 105 91 85  Resp: 14 16 20 19   Temp:  98.7 F (37.1 C) 98.4 F (36.9 C) 98.1 F (36.7 C)  TempSrc:  Oral  Oral  SpO2: 96% 96% 97% 100%  Weight:      Height:        Intake/Output Summary (Last 24 hours) at 12/21/2022 5284 Last data filed at 12/21/2022  1610 Gross per 24 hour  Intake 1148.59 ml  Output --  Net 1148.59 ml   Filed Weights   12/20/22 0105 12/20/22 0447  Weight: (!) 141 kg (!) 139.2 kg    Examination:  General exam: Appears calm and comfortable, morbidly obese Respiratory system: Clear to auscultation. Respiratory effort normal. Cardiovascular system: S1 & S2 heard, RRR.  Gastrointestinal system: Abdomen is soft Central nervous system: Alert and awake Extremities: Left lower extremity erythema and tenderness Skin: No significant lesions noted Psychiatry: Flat  affect.    Data Reviewed: I have personally reviewed following labs and imaging studies  CBC: Recent Labs  Lab 12/19/22 1101 12/20/22 0142 12/21/22 0410  WBC 14.0* 10.8* 8.0  NEUTROABS 12.3* 9.7*  --   HGB 14.7 15.1 13.4  HCT 45.1 46.3 40.9  MCV 98.5 97.7 97.4  PLT 230 222 216   Basic Metabolic Panel: Recent Labs  Lab 12/19/22 1101 12/20/22 0133 12/21/22 0410  NA 137 135 134*  K 3.7 4.4 3.7  CL 102 103 102  CO2 26 22 24   GLUCOSE 333* 369* 258*  BUN 16 15 12   CREATININE 0.88 0.94 0.75  CALCIUM 9.8 9.6 9.1  MG  --  2.0 1.9  PHOS  --  3.3  --    GFR: Estimated Creatinine Clearance: 143.9 mL/min (by C-G formula based on SCr of 0.75 mg/dL). Liver Function Tests: Recent Labs  Lab 12/20/22 0133 12/21/22 0410  AST 25 13*  ALT 45* 32  ALKPHOS 122 85  BILITOT 0.8 0.7  PROT 6.7 5.8*  ALBUMIN 3.3* 2.6*   No results for input(s): "LIPASE", "AMYLASE" in the last 168 hours. No results for input(s): "AMMONIA" in the last 168 hours. Coagulation Profile: Recent Labs  Lab 12/20/22 0133  INR 1.0   Cardiac Enzymes: No results for input(s): "CKTOTAL", "CKMB", "CKMBINDEX", "TROPONINI" in the last 168 hours. BNP (last 3 results) No results for input(s): "PROBNP" in the last 8760 hours. HbA1C: Recent Labs    12/19/22 0958 12/20/22 0142  HGBA1C 14.0* 14.3*   CBG: Recent Labs  Lab 12/20/22 0740 12/20/22 1053 12/20/22 1610 12/20/22 2110 12/21/22 0722  GLUCAP 229* 281* 226* 361* 255*   Lipid Profile: No results for input(s): "CHOL", "HDL", "LDLCALC", "TRIG", "CHOLHDL", "LDLDIRECT" in the last 72 hours. Thyroid Function Tests: No results for input(s): "TSH", "T4TOTAL", "FREET4", "T3FREE", "THYROIDAB" in the last 72 hours. Anemia Panel: No results for input(s): "VITAMINB12", "FOLATE", "FERRITIN", "TIBC", "IRON", "RETICCTPCT" in the last 72 hours. Sepsis Labs: Recent Labs  Lab 12/19/22 1105 12/20/22 0150  LATICACIDVEN 1.6 1.4    Recent Results (from the  past 240 hour(s))  Culture, blood (routine x 2)     Status: None (Preliminary result)   Collection Time: 12/19/22 11:06 AM   Specimen: BLOOD  Result Value Ref Range Status   Specimen Description   Final    BLOOD RIGHT ASSIST CONTROL Performed at [facility], [address], [place], [place] [address]    Special Requests   Final    BOTTLES DRAWN AEROBIC AND ANAEROBIC Blood Culture adequate volume Performed at [facility], [address] [place], [place] [address]    Culture  Setup Time   Final    GRAM POSITIVE COCCI IN BOTH AEROBIC AND ANAEROBIC BOTTLES CRITICAL RESULT CALLED TO, READ BACK BY AND VERIFIED WITH: TONI WALKER AT 2336 12/19/2022 BY T KENNEDY CRITICAL VALUE NOTED.  VALUE IS CONSISTENT WITH PREVIOUSLY REPORTED AND CALLED VALUE.    Culture   Final  GRAM POSITIVE COCCI IDENTIFICATION TO FOLLOW Performed at [facility], [address], [place], [place] [address]    Report Status PENDING  Incomplete  Culture, blood (routine x 2)     Status: Abnormal (Preliminary result)   Collection Time: 12/19/22 11:13 AM   Specimen: Right Antecubital; Blood  Result Value Ref Range Status   Specimen Description   Final    RIGHT ANTECUBITAL Performed at [facility], [address], [place], [place] [address]    Special Requests   Final    BOTTLES DRAWN AEROBIC AND ANAEROBIC Blood Culture adequate volume Performed at [facility], [address] [place], [place] [address]    Culture  Setup Time   Final    GRAM POSITIVE COCCI IN BOTH AEROBIC AND ANAEROBIC BOTTLES CRITICAL RESULT CALLED TO, READ BACK BY AND VERIFIED WITH: TONI WALKER AT 2336 12/19/2022 BY T KENNEDY CRITICAL RESULT CALLED TO, READ BACK BY AND VERIFIED WITH: BLaural Benes RN 12/20/22 @ 0550 BY AB    Culture (A)  Final    GROUP B STREP(S.AGALACTIAE)ISOLATED SUSCEPTIBILITIES TO FOLLOW Performed at [facility], [address], [place], [place] [address]    Report Status PENDING  Incomplete  Blood Culture ID  Panel (Reflexed)     Status: Abnormal   Collection Time: 12/19/22 11:13 AM  Result Value Ref Range Status   Enterococcus faecalis NOT DETECTED NOT DETECTED Final   Enterococcus Faecium NOT DETECTED NOT DETECTED Final   Listeria monocytogenes NOT DETECTED NOT DETECTED Final   Staphylococcus species NOT DETECTED NOT DETECTED Final   Staphylococcus aureus (BCID) NOT DETECTED NOT DETECTED Final   Staphylococcus epidermidis NOT DETECTED NOT DETECTED Final   Staphylococcus lugdunensis NOT DETECTED NOT DETECTED Final   Streptococcus species DETECTED (A) NOT DETECTED Final    Comment: CRITICAL RESULT CALLED TO, READ BACK BY AND VERIFIED WITH: BLaural Benes RN 12/20/22 @ 0550 BY AB    Streptococcus agalactiae DETECTED (A) NOT DETECTED Final    Comment: CRITICAL RESULT CALLED TO, READ BACK BY AND VERIFIED WITH: BLaural Benes RN 12/20/22 @ 0550 BY AB    Streptococcus pneumoniae NOT DETECTED NOT DETECTED Final   Streptococcus pyogenes NOT DETECTED NOT DETECTED Final   A.calcoaceticus-baumannii NOT DETECTED NOT DETECTED Final   Bacteroides fragilis NOT DETECTED NOT DETECTED Final   Enterobacterales NOT DETECTED NOT DETECTED Final   Enterobacter cloacae complex NOT DETECTED NOT DETECTED Final   Escherichia coli NOT DETECTED NOT DETECTED Final   Klebsiella aerogenes NOT DETECTED NOT DETECTED Final   Klebsiella oxytoca NOT DETECTED NOT DETECTED Final   Klebsiella pneumoniae NOT DETECTED NOT DETECTED Final   Proteus species NOT DETECTED NOT DETECTED Final   Salmonella species NOT DETECTED NOT DETECTED Final   Serratia marcescens NOT DETECTED NOT DETECTED Final   Haemophilus influenzae NOT DETECTED NOT DETECTED Final   Neisseria meningitidis NOT DETECTED NOT DETECTED Final   Pseudomonas aeruginosa NOT DETECTED NOT DETECTED Final   Stenotrophomonas maltophilia NOT DETECTED NOT DETECTED Final   Candida albicans NOT DETECTED NOT DETECTED Final   Candida auris NOT DETECTED NOT DETECTED Final   Candida glabrata  NOT DETECTED NOT DETECTED Final   Candida krusei NOT DETECTED NOT DETECTED Final   Candida parapsilosis NOT DETECTED NOT DETECTED Final   Candida tropicalis NOT DETECTED NOT DETECTED Final   Cryptococcus neoformans/gattii NOT DETECTED NOT DETECTED Final    Comment: Performed at [facility], [address] [place], [place] [address]  Blood Culture (routine  x 2)     Status: None (Preliminary result)   Collection Time: 12/20/22  1:33 AM   Specimen: BLOOD  Result Value Ref Range Status   Specimen Description BLOOD BLOOD RIGHT ARM  Final   Special Requests   Final    BOTTLES DRAWN AEROBIC AND ANAEROBIC Blood Culture adequate volume   Culture   Final    NO GROWTH 1 DAY Performed at [facility], [address], [place], [place] [address]    Report Status PENDING  Incomplete  Blood Culture (routine x 2)     Status: None (Preliminary result)   Collection Time: 12/20/22  1:50 AM   Specimen: Right Antecubital; Blood  Result Value Ref Range Status   Specimen Description RIGHT ANTECUBITAL  Final   Special Requests   Final    BOTTLES DRAWN AEROBIC AND ANAEROBIC Blood Culture results may not be optimal due to an excessive volume of blood received in culture bottles   Culture   Final    NO GROWTH 1 DAY Performed at [facility], [address], [place], [place] [address]    Report Status PENDING  Incomplete         Radiology Studies: ECHOCARDIOGRAM COMPLETE  Result Date: 12/20/2022    ECHOCARDIOGRAM REPORT   Patient Name:   Jonathan Sellers Weichel Date of Exam: 12/20/2022 Medical Rec #:  [id number]    Height:       67.0 in Accession #:    [id number]   Weight:       306.9 lb Date of Birth:  August 17, 1971    BSA:          2.426 m Patient Age:    51 years     BP:           159/104 mmHg Patient Gender: M            HR:           95 bpm. [facility] Procedure: 2D Echo, Cardiac Doppler and Color Doppler Indications:    Bacteremia R78.81  History:        Patient has no prior history of  Echocardiogram examinations.                 Signs/Symptoms:Bacteremia; Risk Factors:Hypertension,                 Dyslipidemia, Diabetes, Non-Smoker and Sleep Apnea.  Sonographer:    Aron Baba Referring Phys: [id number] [facility]  Sonographer Comments: Image acquisition challenging due to patient body habitus and Image acquisition challenging due to respiratory motion. IMPRESSIONS  1. Left ventricular ejection fraction, by estimation, is 55 to 60%. The left ventricle has normal function. The left ventricle has no regional wall motion abnormalities. There is moderate left ventricular hypertrophy. Left ventricular diastolic parameters are indeterminate.  2. Right ventricular systolic function is normal. The right ventricular size is normal. Tricuspid regurgitation signal is inadequate for assessing PA pressure.  3. Possible small PFO with mild left to right shunt.  4. The mitral valve is normal in structure. Trivial mitral valve regurgitation. No evidence of mitral stenosis.  5. The tricuspid valve is abnormal.  6. The aortic valve has an indeterminant number of cusps. Aortic valve regurgitation is not visualized. No aortic stenosis is present.  7. Aortic dilatation noted. There is mild dilatation of the ascending aorta, measuring 36 mm.  8. The inferior vena cava is dilated in size with >50% respiratory variability, suggesting right atrial pressure  of 8 mmHg. FINDINGS  Left Ventricle: Left ventricular ejection fraction, by estimation, is 55 to 60%. The left ventricle has normal function. The left ventricle has no regional wall motion abnormalities. The left ventricular internal cavity size was normal in size. There is  moderate left ventricular hypertrophy. Left ventricular diastolic parameters are indeterminate. Right Ventricle: The right ventricular size is normal. Right vetricular wall thickness was not well visualized. Right ventricular systolic function is normal. Tricuspid regurgitation signal is  inadequate for assessing PA pressure. Left Atrium: Left atrial size was normal in size. Right Atrium: Right atrial size was normal in size. Pericardium: There is no evidence of pericardial effusion. Mitral Valve: The mitral valve is normal in structure. Trivial mitral valve regurgitation. No evidence of mitral valve stenosis. Tricuspid Valve: The tricuspid valve is abnormal. Tricuspid valve regurgitation is trivial. No evidence of tricuspid stenosis. Aortic Valve: The aortic valve has an indeterminant number of cusps. Aortic valve regurgitation is not visualized. No aortic stenosis is present. Aortic valve mean gradient measures 3.3 mmHg. Aortic valve peak gradient measures 7.1 mmHg. Aortic valve area, by VTI measures 3.03 cm. Pulmonic Valve: The pulmonic valve was not well visualized. Pulmonic valve regurgitation is not visualized. No evidence of pulmonic stenosis. Aorta: The aortic root is normal in size and structure and aortic dilatation noted. There is mild dilatation of the ascending aorta, measuring 36 mm. Venous: The inferior vena cava is dilated in size with greater than 50% respiratory variability, suggesting right atrial pressure of 8 mmHg. IAS/Shunts: The interatrial septum was not well visualized.  LEFT VENTRICLE PLAX 2D LVIDd:         4.90 cm   Diastology LVIDs:         3.30 cm   LV e' medial:    8.64 cm/s LV PW:         1.40 cm   LV E/e' medial:  8.8 LV IVS:        1.30 cm   LV e' lateral:   9.67 cm/s LVOT diam:     2.00 cm   LV E/e' lateral: 7.9 LV SV:         64 LV SV Index:   26 LVOT Area:     3.14 cm  RIGHT VENTRICLE RV S prime:     13.20 cm/s TAPSE (M-mode): 2.0 cm LEFT ATRIUM             Index        RIGHT ATRIUM           Index LA diam:        3.40 cm 1.40 cm/m   RA Area:     16.00 cm LA Vol (A2C):   76.0 ml 31.33 ml/m  RA Volume:   41.50 ml  17.11 ml/m LA Vol (A4C):   39.0 ml 16.08 ml/m LA Biplane Vol: 56.6 ml 23.33 ml/m  AORTIC VALVE AV Area (Vmax):    2.82 cm AV Area (Vmean):   2.94  cm AV Area (VTI):     3.03 cm AV Vmax:           133.67 cm/s AV Vmean:          83.339 cm/s AV VTI:            0.211 m AV Peak Grad:      7.1 mmHg AV Mean Grad:      3.3 mmHg LVOT Vmax:         120.00 cm/s LVOT Vmean:  78.000 cm/s LVOT VTI:          0.204 m LVOT/AV VTI ratio: 0.97  AORTA Ao Root diam: 3.80 cm Ao Asc diam:  3.60 cm MITRAL VALVE MV Area (PHT): 4.21 cm    SHUNTS MV Decel Time: 180 msec    Systemic VTI:  0.20 m MR Peak grad: 3.6 mmHg     Systemic Diam: 2.00 cm MR Vmax:      94.70 cm/s MV E velocity: 76.30 cm/s MV A velocity: 81.00 cm/s MV E/A ratio:  0.94 Dina Rich MD Electronically signed by Dina Rich MD Signature Date/Time: 12/20/2022/3:10:21 PM    Final    DG Chest Port 1 View  Result Date: 12/20/2022 CLINICAL DATA:  Questionable sepsis.  Positive blood cultures. EXAM: PORTABLE CHEST 1 VIEW COMPARISON:  Portable chest yesterday at 11:15 a.m. FINDINGS: 1:32 a.m. The heart is moderately enlarged. Again noted are central vascular prominence, mild basilar interstitial consolidation suggesting mild interstitial edema, and trace pleural effusions. No focal alveolar infiltrate is seen. The mediastinum is normally outlined. Thoracic cage is intact. There is overlying monitor wiring. Compare: Unchanged. IMPRESSION: Cardiomegaly with central vascular prominence and mild basilar interstitial edema. Trace pleural effusions. No significant change since yesterday's study. Electronically Signed   By: Almira Bar M.D.   On: 12/20/2022 01:58   US Venous Img Lower  Left (DVT Study)  Result Date: 12/19/2022 CLINICAL DATA:  Leg pain EXAM: LEFT LOWER EXTREMITY VENOUS DOPPLER ULTRASOUND TECHNIQUE: Gray-scale sonography with compression, as well as color and duplex ultrasound, were performed to evaluate the deep venous system(s) from the level of the common femoral vein through the popliteal and proximal calf veins. COMPARISON:  None FINDINGS: VENOUS Normal compressibility of the common femoral,  superficial femoral, and popliteal veins, as well as the visualized calf veins. Visualized portions of profunda femoral vein and great saphenous vein unremarkable. No filling defects to suggest DVT on grayscale or color Doppler imaging. Doppler waveforms show normal direction of venous flow, normal respiratory plasticity and response to augmentation. Limited views of the contralateral common femoral vein are unremarkable. OTHER Subcutaneous edema Limitations: none IMPRESSION: 1. No evidence of LEFT lower extremity deep venous thrombosis. 2. Subcutaneous edema noted. Electronically Signed   By: Genevive Bi M.D.   On: 12/19/2022 12:05   DG Chest Port 1 View  Result Date: 12/19/2022 CLINICAL DATA:  Provided history: Leg pain. EXAM: PORTABLE CHEST 1 VIEW COMPARISON:  No pertinent prior exams available for comparison. FINDINGS: Cardiomegaly. Central pulmonary vascular congestion and suspected mild interstitial pulmonary edema. No appreciable airspace consolidation. No evidence of pleural effusion or pneumothorax. No acute osseous abnormality identified. IMPRESSION: 1. Cardiomegaly. 2. Central pulmonary vascular congestion and suspected mild interstitial pulmonary edema. Electronically Signed   By: Jackey Loge D.O.   On: 12/19/2022 11:24        Scheduled Meds:  atorvastatin  40 mg Oral Daily   enoxaparin (LOVENOX) injection  40 mg Subcutaneous Q24H   feeding supplement (GLUCERNA SHAKE)  237 mL Oral TID BM   lisinopril  20 mg Oral Daily   And   hydrochlorothiazide  12.5 mg Oral Daily   insulin aspart  0-15 Units Subcutaneous TID WC   insulin aspart  0-5 Units Subcutaneous QHS   insulin glargine-yfgn  10 Units Subcutaneous QHS   Continuous Infusions:  penicillin G potassium 12 Million Units in dextrose 5 % 500 mL CONTINUOUS infusion 41.7 mL/hr at 12/21/22 0358     LOS: 1 day  Time spent: 35 minutes    Roper Tolson Hoover Brunette, DO [facility]  If 7PM-7AM, please contact  night-coverage [email or web address] 12/21/2022, 9:37 AM

## 2022-12-21 NOTE — TOC Benefit Eligibility Note (Signed)
Patient Product/process development scientist completed.    The patient is currently admitted and upon discharge could be taking Dexcom G7 Sensors.  The current 30 day co-pay is $73.61.   The patient is currently admitted and upon discharge could be taking Dexcom G7 Reader.  The current 30 day co-pay is $60.19.   The patient is insured through [employer]   This test claim was processed through [facility]- copay amounts may vary at other pharmacies due to pharmacy/plan contracts, or as the patient moves through the different stages of their insurance plan.  Roland Earl, CPHT Pharmacy Patient Advocate Specialist [facility] Patient Advocate Team Direct Number: [phone]  Fax: [phone]

## 2022-12-21 NOTE — Telephone Encounter (Signed)
Pharmacy Patient Advocate Encounter  Insurance verification completed.    The patient is insured through [employer]   The patient is currently admitted and ran test claims for the following: Dexcom G7 Sensors and Reader.  Copays and coinsurance results were relayed to Inpatient clinical team.

## 2022-12-22 DIAGNOSIS — L03116 Cellulitis of left lower limb: Secondary | ICD-10-CM | POA: Diagnosis not present

## 2022-12-22 LAB — CULTURE, BLOOD (ROUTINE X 2): Special Requests: ADEQUATE

## 2022-12-22 LAB — GLUCOSE, CAPILLARY: Glucose-Capillary: 252 mg/dL — ABNORMAL HIGH (ref 70–99)

## 2022-12-22 MED ORDER — DEXCOM G7 SENSOR MISC: 0 refills | Status: DC

## 2022-12-22 MED ORDER — CEFADROXIL 500 MG PO CAPS: 500.0000 mg | ORAL_CAPSULE | Freq: Two times a day (BID) | ORAL | 0 refills | Status: AC

## 2022-12-22 MED ORDER — DEXCOM G7 RECEIVER DEVI: 0 refills | Status: AC

## 2022-12-22 MED ORDER — METFORMIN HCL ER 500 MG PO TB24: 500.0000 mg | ORAL_TABLET | Freq: Every day | ORAL | 0 refills | Status: DC

## 2022-12-22 NOTE — Progress Notes (Signed)
Patient discharged home today, transported home by spouse. Discharge paperwork went over with patient, patient verbalized understanding. Belongings sent home with patient.  

## 2022-12-22 NOTE — Discharge Summary (Signed)
Physician Discharge Summary  Jonathan Sellers:[id number] DOB: November 04, 1970 DOA: 12/20/2022  PCP: Benetta Spar, MD  Admit date: 12/20/2022  Discharge date: 12/22/2022  Admitted From:Home  Disposition:  Home  Recommendations for Outpatient Follow-up:  Follow up with PCP in 1-2 weeks Follow-up with endocrinology, Dr. Celene Squibb reader and sensor prescription provided Change metformin to metformin XR Continue on cefadroxil as prescribed for 8 more days for total 10-day course of treatment for strep bacteremia and cellulitis Continue other home medications as prior  Home Health: None  Equipment/Devices: None  Discharge Condition:Stable  CODE STATUS: Full  Diet recommendation: Heart Healthy/carb modified  Brief/Interim Summary: Jonathan Sellers is a 52 y.o. male with medical history significant of T2DM, hypertension, hyperlipidemia, morbid obesity who presents to the emergency department due to being called to return to the ED due to positive blood cultures.  He was seen in the ED for left lower extremity cellulitis and discharged on oral Keflex and called back for group be Streptococcus bacteremia and was started on IV penicillin G.  Repeat blood cultures were ordered and he was seen by ID and also had 2D echocardiogram.  No repeat growth on both blood cultures noted and 2D echocardiogram without any signs of vegetations.  He has otherwise done quite well with IV medication and may be transition to oral cefadroxil as prescribed above for 8 more days to complete a 10-day course of treatment.  He was also seen by diabetes coordinator with new medication recommendation and Dexcom prescribed.  He will follow-up with endocrinology outpatient.  Discharge Diagnoses:  Principal Problem:   Cellulitis of left lower extremity Active Problems:   Essential hypertension, benign   Mixed hyperlipidemia   Morbid obesity (HCC)   Hypoalbuminemia due to protein-calorie malnutrition (HCC)   Type 2  diabetes mellitus with hyperglycemia (HCC)   Bacteremia  Principal discharge diagnosis: Group B strep bacteremia secondary to left lower extremity cellulitis.  Discharge Instructions  Discharge Instructions     Diet - low sodium heart healthy   Complete by: As directed    Increase activity slowly   Complete by: As directed       Allergies as of 12/22/2022   No Known Allergies      Medication List     STOP taking these medications    cephALEXin 500 MG capsule Commonly known as: KEFLEX   metFORMIN 500 MG tablet Commonly known as: GLUCOPHAGE Replaced by: metFORMIN 500 MG 24 hr tablet       TAKE these medications    acetaminophen 500 MG tablet Commonly known as: TYLENOL Take 1,000 mg by mouth 2 (two) times daily as needed for moderate pain or headache.   Aspirin Low Dose 81 MG tablet Generic drug: aspirin EC Take 81 mg by mouth daily.   atorvastatin 20 MG tablet Commonly known as: LIPITOR Take 40 mg by mouth daily.   cefadroxil 500 MG capsule Commonly known as: DURICEF Take 1 capsule (500 mg total) by mouth 2 (two) times daily for 8 days.   Dexcom G7 Receiver Devi Use as instructed.   Dexcom G7 Sensor Misc Use as instructed.   glipiZIDE 10 MG tablet Commonly known as: GLUCOTROL Take 10 mg by mouth 2 (two) times daily before a meal.   Lantus SoloStar 100 UNIT/ML Solostar Pen Generic drug: insulin glargine Inject 60 Units into the skin at bedtime.   lisinopril-hydrochlorothiazide 20-12.5 MG tablet Commonly known as: ZESTORETIC Take 1 tablet by mouth 2 (two) times daily.  metFORMIN 500 MG 24 hr tablet Commonly known as: GLUCOPHAGE-XR Take 1 tablet (500 mg total) by mouth daily with breakfast. Replaces: metFORMIN 500 MG tablet   OVER THE COUNTER MEDICATION Apply 1 application topically daily as needed (pain). CBD cream   potassium chloride 10 MEQ tablet Commonly known as: KLOR-CON Take 10 mEq by mouth daily.   sodium chloride 0.65 % Soln  nasal spray Commonly known as: OCEAN Place 1 spray into both nostrils as needed for congestion.   torsemide 20 MG tablet Commonly known as: DEMADEX Take 20 mg by mouth daily.        Follow-up Information     Fanta, Wayland [place], MD. Schedule an appointment as soon as possible for a visit in 1 week(s).   Specialty: Internal Medicine Contact information: [address] [place] [address] [phone]                No Known Allergies  Consultations: ID   Procedures/Studies: ECHOCARDIOGRAM COMPLETE  Result Date: 12/20/2022    ECHOCARDIOGRAM REPORT   Patient Name:   Jonathan Sellers Date of Exam: 12/20/2022 Medical Rec #:  [id number]    Height:       67.0 in Accession #:    [id number]   Weight:       306.9 lb Date of Birth:  12/03/1970    BSA:          2.426 m Patient Age:    51 years     BP:           159/104 mmHg Patient Gender: M            HR:           95 bpm. [facility] Procedure: 2D Echo, Cardiac Doppler and Color Doppler Indications:    Bacteremia R78.81  History:        Patient has no prior history of Echocardiogram examinations.                 Signs/Symptoms:Bacteremia; Risk Factors:Hypertension,                 Dyslipidemia, Diabetes, Non-Smoker and Sleep Apnea.  Sonographer:    Aron Baba Referring Phys: [id number] Kendal Ghazarian D [facility]  Sonographer Comments: Image acquisition challenging due to patient body habitus and Image acquisition challenging due to respiratory motion. IMPRESSIONS  1. Left ventricular ejection fraction, by estimation, is 55 to 60%. The left ventricle has normal function. The left ventricle has no regional wall motion abnormalities. There is moderate left ventricular hypertrophy. Left ventricular diastolic parameters are indeterminate.  2. Right ventricular systolic function is normal. The right ventricular size is normal. Tricuspid regurgitation signal is inadequate for assessing PA pressure.  3. Possible small PFO with mild left to  right shunt.  4. The mitral valve is normal in structure. Trivial mitral valve regurgitation. No evidence of mitral stenosis.  5. The tricuspid valve is abnormal.  6. The aortic valve has an indeterminant number of cusps. Aortic valve regurgitation is not visualized. No aortic stenosis is present.  7. Aortic dilatation noted. There is mild dilatation of the ascending aorta, measuring 36 mm.  8. The inferior vena cava is dilated in size with >50% respiratory variability, suggesting right atrial pressure of 8 mmHg. FINDINGS  Left Ventricle: Left ventricular ejection fraction, by estimation, is 55 to 60%. The left ventricle has normal function. The left ventricle has no regional wall motion abnormalities. The left ventricular internal cavity size  was normal in size. There is  moderate left ventricular hypertrophy. Left ventricular diastolic parameters are indeterminate. Right Ventricle: The right ventricular size is normal. Right vetricular wall thickness was not well visualized. Right ventricular systolic function is normal. Tricuspid regurgitation signal is inadequate for assessing PA pressure. Left Atrium: Left atrial size was normal in size. Right Atrium: Right atrial size was normal in size. Pericardium: There is no evidence of pericardial effusion. Mitral Valve: The mitral valve is normal in structure. Trivial mitral valve regurgitation. No evidence of mitral valve stenosis. Tricuspid Valve: The tricuspid valve is abnormal. Tricuspid valve regurgitation is trivial. No evidence of tricuspid stenosis. Aortic Valve: The aortic valve has an indeterminant number of cusps. Aortic valve regurgitation is not visualized. No aortic stenosis is present. Aortic valve mean gradient measures 3.3 mmHg. Aortic valve peak gradient measures 7.1 mmHg. Aortic valve area, by VTI measures 3.03 cm. Pulmonic Valve: The pulmonic valve was not well visualized. Pulmonic valve regurgitation is not visualized. No evidence of pulmonic  stenosis. Aorta: The aortic root is normal in size and structure and aortic dilatation noted. There is mild dilatation of the ascending aorta, measuring 36 mm. Venous: The inferior vena cava is dilated in size with greater than 50% respiratory variability, suggesting right atrial pressure of 8 mmHg. IAS/Shunts: The interatrial septum was not well visualized.  LEFT VENTRICLE PLAX 2D LVIDd:         4.90 cm   Diastology LVIDs:         3.30 cm   LV e' medial:    8.64 cm/s LV PW:         1.40 cm   LV E/e' medial:  8.8 LV IVS:        1.30 cm   LV e' lateral:   9.67 cm/s LVOT diam:     2.00 cm   LV E/e' lateral: 7.9 LV SV:         64 LV SV Index:   26 LVOT Area:     3.14 cm  RIGHT VENTRICLE RV S prime:     13.20 cm/s TAPSE (M-mode): 2.0 cm LEFT ATRIUM             Index        RIGHT ATRIUM           Index LA diam:        3.40 cm 1.40 cm/m   RA Area:     16.00 cm LA Vol (A2C):   76.0 ml 31.33 ml/m  RA Volume:   41.50 ml  17.11 ml/m LA Vol (A4C):   39.0 ml 16.08 ml/m LA Biplane Vol: 56.6 ml 23.33 ml/m  AORTIC VALVE AV Area (Vmax):    2.82 cm AV Area (Vmean):   2.94 cm AV Area (VTI):     3.03 cm AV Vmax:           133.67 cm/s AV Vmean:          83.339 cm/s AV VTI:            0.211 m AV Peak Grad:      7.1 mmHg AV Mean Grad:      3.3 mmHg LVOT Vmax:         120.00 cm/s LVOT Vmean:        78.000 cm/s LVOT VTI:          0.204 m LVOT/AV VTI ratio: 0.97  AORTA Ao Root diam: 3.80 cm Ao Asc diam:  3.60 cm MITRAL  VALVE MV Area (PHT): 4.21 cm    SHUNTS MV Decel Time: 180 msec    Systemic VTI:  0.20 m MR Peak grad: 3.6 mmHg     Systemic Diam: 2.00 cm MR Vmax:      94.70 cm/s MV E velocity: 76.30 cm/s MV A velocity: 81.00 cm/s MV E/A ratio:  0.94 Dina Rich MD Electronically signed by Dina Rich MD Signature Date/Time: 12/20/2022/3:10:21 PM    Final    DG Chest Port 1 View  Result Date: 12/20/2022 CLINICAL DATA:  Questionable sepsis.  Positive blood cultures. EXAM: PORTABLE CHEST 1 VIEW COMPARISON:  Portable chest  yesterday at 11:15 a.m. FINDINGS: 1:32 a.m. The heart is moderately enlarged. Again noted are central vascular prominence, mild basilar interstitial consolidation suggesting mild interstitial edema, and trace pleural effusions. No focal alveolar infiltrate is seen. The mediastinum is normally outlined. Thoracic cage is intact. There is overlying monitor wiring. Compare: Unchanged. IMPRESSION: Cardiomegaly with central vascular prominence and mild basilar interstitial edema. Trace pleural effusions. No significant change since yesterday's study. Electronically Signed   By: Almira Bar M.D.   On: 12/20/2022 01:58   US Venous Img Lower  Left (DVT Study)  Result Date: 12/19/2022 CLINICAL DATA:  Leg pain EXAM: LEFT LOWER EXTREMITY VENOUS DOPPLER ULTRASOUND TECHNIQUE: Gray-scale sonography with compression, as well as color and duplex ultrasound, were performed to evaluate the deep venous system(s) from the level of the common femoral vein through the popliteal and proximal calf veins. COMPARISON:  None FINDINGS: VENOUS Normal compressibility of the common femoral, superficial femoral, and popliteal veins, as well as the visualized calf veins. Visualized portions of profunda femoral vein and great saphenous vein unremarkable. No filling defects to suggest DVT on grayscale or color Doppler imaging. Doppler waveforms show normal direction of venous flow, normal respiratory plasticity and response to augmentation. Limited views of the contralateral common femoral vein are unremarkable. OTHER Subcutaneous edema Limitations: none IMPRESSION: 1. No evidence of LEFT lower extremity deep venous thrombosis. 2. Subcutaneous edema noted. Electronically Signed   By: Genevive Bi M.D.   On: 12/19/2022 12:05   DG Chest Port 1 View  Result Date: 12/19/2022 CLINICAL DATA:  Provided history: Leg pain. EXAM: PORTABLE CHEST 1 VIEW COMPARISON:  No pertinent prior exams available for comparison. FINDINGS: Cardiomegaly. Central  pulmonary vascular congestion and suspected mild interstitial pulmonary edema. No appreciable airspace consolidation. No evidence of pleural effusion or pneumothorax. No acute osseous abnormality identified. IMPRESSION: 1. Cardiomegaly. 2. Central pulmonary vascular congestion and suspected mild interstitial pulmonary edema. Electronically Signed   By: Jackey Loge D.O.   On: 12/19/2022 11:24     Discharge Exam: Vitals:   12/22/22 0512 12/22/22 0835  BP: (!) 147/84 (!) 137/98  Pulse: 91   Resp: 18   Temp: 98.1 F (36.7 C)   SpO2: 100%    Vitals:   12/21/22 1433 12/21/22 2133 12/22/22 0512 12/22/22 0835  BP: (!) 157/99 (!) 143/85 (!) 147/84 (!) 137/98  Pulse: 90 88 91   Resp: 18 20 18    Temp: 98.4 F (36.9 C) 97.7 F (36.5 C) 98.1 F (36.7 C)   TempSrc: Oral Oral Oral   SpO2: 98% 100% 100%   Weight:      Height:        General: Pt is alert, awake, not in acute distress Cardiovascular: RRR, S1/S2 +, no rubs, no gallops Respiratory: CTA bilaterally, no wheezing, no rhonchi Abdominal: Soft, NT, ND, bowel sounds + Extremities: no edema, no cyanosis  The results of significant diagnostics from this hospitalization (including imaging, microbiology, ancillary and laboratory) are listed below for reference.     Microbiology: Recent Results (from the past 240 hour(s))  Culture, blood (routine x 2)     Status: Abnormal   Collection Time: 12/19/22 11:06 AM   Specimen: BLOOD  Result Value Ref Range Status   Specimen Description   Final    BLOOD RIGHT ASSIST CONTROL Performed at [facility], [address] [place], [place] [address]    Special Requests   Final    BOTTLES DRAWN AEROBIC AND ANAEROBIC Blood Culture adequate volume Performed at [facility], [address], [place], [place] [address]    Culture  Setup Time   Final    GRAM POSITIVE COCCI IN BOTH AEROBIC AND ANAEROBIC BOTTLES CRITICAL RESULT CALLED TO, READ BACK BY AND VERIFIED WITH: TONI WALKER AT 2336  12/19/2022 BY T KENNEDY CRITICAL VALUE NOTED.  VALUE IS CONSISTENT WITH PREVIOUSLY REPORTED AND CALLED VALUE.    Culture (A)  Final    GROUP B STREP(S.AGALACTIAE)ISOLATED SUSCEPTIBILITIES PERFORMED ON PREVIOUS CULTURE WITHIN THE LAST 5 DAYS. Performed at [facility], [address] [place], [place] [address]    Report Status 12/22/2022 FINAL  Final  Culture, blood (routine x 2)     Status: Abnormal   Collection Time: 12/19/22 11:13 AM   Specimen: Right Antecubital; Blood  Result Value Ref Range Status   Specimen Description   Final    RIGHT ANTECUBITAL Performed at [facility], [address], [place], [place] [address]    Special Requests   Final    BOTTLES DRAWN AEROBIC AND ANAEROBIC Blood Culture adequate volume Performed at [facility], [address], [place], [place] [address]    Culture  Setup Time   Final    GRAM POSITIVE COCCI IN BOTH AEROBIC AND ANAEROBIC BOTTLES CRITICAL RESULT CALLED TO, READ BACK BY AND VERIFIED WITH: TONI WALKER AT 2336 12/19/2022 BY T KENNEDY CRITICAL RESULT CALLED TO, READ BACK BY AND VERIFIED WITH: Gean Quint RN 12/20/22 @ 0550 BY AB Performed at [facility], [address], [place], [place] [address]    Culture GROUP B STREP(S.AGALACTIAE)ISOLATED (A)  Final   Report Status 12/22/2022 FINAL  Final   Organism ID, Bacteria GROUP B STREP(S.AGALACTIAE)ISOLATED  Final      Susceptibility   Group b strep(s.agalactiae)isolated - MIC*    CLINDAMYCIN >=1 RESISTANT Resistant     AMPICILLIN <=0.25 SENSITIVE Sensitive     ERYTHROMYCIN >=8 RESISTANT Resistant     VANCOMYCIN 0.5 SENSITIVE Sensitive     CEFTRIAXONE <=0.12 SENSITIVE Sensitive     LEVOFLOXACIN 0.5 SENSITIVE Sensitive     PENICILLIN <=0.06 SENSITIVE Sensitive     * GROUP B STREP(S.AGALACTIAE)ISOLATED  Blood Culture ID Panel (Reflexed)     Status: Abnormal   Collection Time: 12/19/22 11:13 AM  Result Value Ref Range Status   Enterococcus faecalis NOT DETECTED NOT DETECTED Final    Enterococcus Faecium NOT DETECTED NOT DETECTED Final   Listeria monocytogenes NOT DETECTED NOT DETECTED Final   Staphylococcus species NOT DETECTED NOT DETECTED Final   Staphylococcus aureus (BCID) NOT DETECTED NOT DETECTED Final   Staphylococcus epidermidis NOT DETECTED NOT DETECTED Final   Staphylococcus lugdunensis NOT DETECTED NOT DETECTED Final   Streptococcus species DETECTED (A) NOT DETECTED Final    Comment: CRITICAL RESULT CALLED TO, READ BACK BY AND VERIFIED WITH: BLaural Benes RN 12/20/22 @ 0550 BY AB    Streptococcus  agalactiae DETECTED (A) NOT DETECTED Final    Comment: CRITICAL RESULT CALLED TO, READ BACK BY AND VERIFIED WITH: B. JOHNSON RN 12/20/22 @ 0550 BY AB    Streptococcus pneumoniae NOT DETECTED NOT DETECTED Final   Streptococcus pyogenes NOT DETECTED NOT DETECTED Final   A.calcoaceticus-baumannii NOT DETECTED NOT DETECTED Final   Bacteroides fragilis NOT DETECTED NOT DETECTED Final   Enterobacterales NOT DETECTED NOT DETECTED Final   Enterobacter cloacae complex NOT DETECTED NOT DETECTED Final   Escherichia coli NOT DETECTED NOT DETECTED Final   Klebsiella aerogenes NOT DETECTED NOT DETECTED Final   Klebsiella oxytoca NOT DETECTED NOT DETECTED Final   Klebsiella pneumoniae NOT DETECTED NOT DETECTED Final   Proteus species NOT DETECTED NOT DETECTED Final   Salmonella species NOT DETECTED NOT DETECTED Final   Serratia marcescens NOT DETECTED NOT DETECTED Final   Haemophilus influenzae NOT DETECTED NOT DETECTED Final   Neisseria meningitidis NOT DETECTED NOT DETECTED Final   Pseudomonas aeruginosa NOT DETECTED NOT DETECTED Final   Stenotrophomonas maltophilia NOT DETECTED NOT DETECTED Final   Candida albicans NOT DETECTED NOT DETECTED Final   Candida auris NOT DETECTED NOT DETECTED Final   Candida glabrata NOT DETECTED NOT DETECTED Final   Candida krusei NOT DETECTED NOT DETECTED Final   Candida parapsilosis NOT DETECTED NOT DETECTED Final   Candida tropicalis NOT  DETECTED NOT DETECTED Final   Cryptococcus neoformans/gattii NOT DETECTED NOT DETECTED Final    Comment: Performed at [facility], [address], [place], [place] [address]  Blood Culture (routine x 2)     Status: None (Preliminary result)   Collection Time: 12/20/22  1:33 AM   Specimen: BLOOD  Result Value Ref Range Status   Specimen Description BLOOD BLOOD RIGHT ARM  Final   Special Requests   Final    BOTTLES DRAWN AEROBIC AND ANAEROBIC Blood Culture adequate volume   Culture   Final    NO GROWTH 2 DAYS Performed at [facility], [address] [place], [place] [address]    Report Status PENDING  Incomplete  Blood Culture (routine x 2)     Status: None (Preliminary result)   Collection Time: 12/20/22  1:50 AM   Specimen: Right Antecubital; Blood  Result Value Ref Range Status   Specimen Description RIGHT ANTECUBITAL  Final   Special Requests   Final    BOTTLES DRAWN AEROBIC AND ANAEROBIC Blood Culture results may not be optimal due to an excessive volume of blood received in culture bottles   Culture   Final    NO GROWTH 2 DAYS Performed at [facility], [address], [place], [place] [address]    Report Status PENDING  Incomplete     Labs: BNP (last 3 results) Recent Labs    12/19/22 1101  BNP 27.0   Basic Metabolic Panel: Recent Labs  Lab 12/19/22 1101 12/20/22 0133 12/21/22 0410  NA 137 135 134*  K 3.7 4.4 3.7  CL 102 103 102  CO2 26 22 24   GLUCOSE 333* 369* 258*  BUN 16 15 12   CREATININE 0.88 0.94 0.75  CALCIUM 9.8 9.6 9.1  MG  --  2.0 1.9  PHOS  --  3.3  --    Liver Function Tests: Recent Labs  Lab 12/20/22 0133 12/21/22 0410  AST 25 13*  ALT 45* 32  ALKPHOS 122 85  BILITOT 0.8 0.7  PROT 6.7 5.8*  ALBUMIN 3.3* 2.6*   No results for input(s): "LIPASE", "AMYLASE" in the last  168 hours. No results for input(s): "AMMONIA" in the last 168 hours. CBC: Recent Labs  Lab 12/19/22 1101 12/20/22 0142 12/21/22 0410  WBC 14.0* 10.8* 8.0   NEUTROABS 12.3* 9.7*  --   HGB 14.7 15.1 13.4  HCT 45.1 46.3 40.9  MCV 98.5 97.7 97.4  PLT 230 222 216   Cardiac Enzymes: No results for input(s): "CKTOTAL", "CKMB", "CKMBINDEX", "TROPONINI" in the last 168 hours. BNP: Invalid input(s): "POCBNP" CBG: Recent Labs  Lab 12/21/22 0722 12/21/22 1141 12/21/22 1615 12/21/22 2129 12/22/22 0722  GLUCAP 255* 263* 260* 255* 252*   D-Dimer No results for input(s): "DDIMER" in the last 72 hours. Hgb A1c Recent Labs    12/19/22 0958 12/20/22 0142  HGBA1C 14.0* 14.3*   Lipid Profile No results for input(s): "CHOL", "HDL", "LDLCALC", "TRIG", "CHOLHDL", "LDLDIRECT" in the last 72 hours. Thyroid function studies No results for input(s): "TSH", "T4TOTAL", "T3FREE", "THYROIDAB" in the last 72 hours.  Invalid input(s): "FREET3" Anemia work up No results for input(s): "VITAMINB12", "FOLATE", "FERRITIN", "TIBC", "IRON", "RETICCTPCT" in the last 72 hours. Urinalysis    Component Value Date/Time   COLORURINE STRAW (A) 12/20/2022 0203   APPEARANCEUR CLEAR 12/20/2022 0203   LABSPEC 1.033 (H) 12/20/2022 0203   PHURINE 6.0 12/20/2022 0203   GLUCOSEU >=500 (A) 12/20/2022 0203   HGBUR NEGATIVE 12/20/2022 0203   BILIRUBINUR NEGATIVE 12/20/2022 0203   KETONESUR 20 (A) 12/20/2022 0203   PROTEINUR 100 (A) 12/20/2022 0203   UROBILINOGEN 0.2 03/11/2015 1703   NITRITE NEGATIVE 12/20/2022 0203   LEUKOCYTESUR NEGATIVE 12/20/2022 0203   Sepsis Labs Recent Labs  Lab 12/19/22 1101 12/20/22 0142 12/21/22 0410  WBC 14.0* 10.8* 8.0   Microbiology Recent Results (from the past 240 hour(s))  Culture, blood (routine x 2)     Status: Abnormal   Collection Time: 12/19/22 11:06 AM   Specimen: BLOOD  Result Value Ref Range Status   Specimen Description   Final    BLOOD RIGHT ASSIST CONTROL Performed at [facility], [address] [place], [place] [address]    Special Requests   Final    BOTTLES DRAWN AEROBIC AND ANAEROBIC Blood Culture adequate  volume Performed at [facility], [address] [place], [place] [address]    Culture  Setup Time   Final    GRAM POSITIVE COCCI IN BOTH AEROBIC AND ANAEROBIC BOTTLES CRITICAL RESULT CALLED TO, READ BACK BY AND VERIFIED WITH: TONI WALKER AT 2336 12/19/2022 BY T KENNEDY CRITICAL VALUE NOTED.  VALUE IS CONSISTENT WITH PREVIOUSLY REPORTED AND CALLED VALUE.    Culture (A)  Final    GROUP B STREP(S.AGALACTIAE)ISOLATED SUSCEPTIBILITIES PERFORMED ON PREVIOUS CULTURE WITHIN THE LAST 5 DAYS. Performed at [facility], [address] [place], [place] [address]    Report Status 12/22/2022 FINAL  Final  Culture, blood (routine x 2)     Status: Abnormal   Collection Time: 12/19/22 11:13 AM   Specimen: Right Antecubital; Blood  Result Value Ref Range Status   Specimen Description   Final    RIGHT ANTECUBITAL Performed at [facility], [address] [place], [place] [address]    Special Requests   Final    BOTTLES DRAWN AEROBIC AND ANAEROBIC Blood Culture adequate volume Performed at [facility], [address] [place], [place] [address]    Culture  Setup Time   Final    GRAM POSITIVE COCCI IN BOTH AEROBIC AND ANAEROBIC BOTTLES CRITICAL RESULT CALLED TO, READ BACK BY AND VERIFIED WITH: Marcha Solders  AT 2336 12/19/2022 BY T KENNEDY CRITICAL RESULT CALLED TO, READ BACK BY AND VERIFIED WITH: Gean Quint RN 12/20/22 @ 0550 BY AB Performed at [facility], [address] [place], [place] [address]    Culture GROUP B STREP(S.AGALACTIAE)ISOLATED (A)  Final   Report Status 12/22/2022 FINAL  Final   Organism ID, Bacteria GROUP B STREP(S.AGALACTIAE)ISOLATED  Final      Susceptibility   Group b strep(s.agalactiae)isolated - MIC*    CLINDAMYCIN >=1 RESISTANT Resistant     AMPICILLIN <=0.25 SENSITIVE Sensitive     ERYTHROMYCIN >=8 RESISTANT Resistant     VANCOMYCIN 0.5 SENSITIVE Sensitive     CEFTRIAXONE <=0.12 SENSITIVE Sensitive     LEVOFLOXACIN 0.5 SENSITIVE Sensitive     PENICILLIN  <=0.06 SENSITIVE Sensitive     * GROUP B STREP(S.AGALACTIAE)ISOLATED  Blood Culture ID Panel (Reflexed)     Status: Abnormal   Collection Time: 12/19/22 11:13 AM  Result Value Ref Range Status   Enterococcus faecalis NOT DETECTED NOT DETECTED Final   Enterococcus Faecium NOT DETECTED NOT DETECTED Final   Listeria monocytogenes NOT DETECTED NOT DETECTED Final   Staphylococcus species NOT DETECTED NOT DETECTED Final   Staphylococcus aureus (BCID) NOT DETECTED NOT DETECTED Final   Staphylococcus epidermidis NOT DETECTED NOT DETECTED Final   Staphylococcus lugdunensis NOT DETECTED NOT DETECTED Final   Streptococcus species DETECTED (A) NOT DETECTED Final    Comment: CRITICAL RESULT CALLED TO, READ BACK BY AND VERIFIED WITH: BLaural Benes RN 12/20/22 @ 0550 BY AB    Streptococcus agalactiae DETECTED (A) NOT DETECTED Final    Comment: CRITICAL RESULT CALLED TO, READ BACK BY AND VERIFIED WITH: BLaural Benes RN 12/20/22 @ 0550 BY AB    Streptococcus pneumoniae NOT DETECTED NOT DETECTED Final   Streptococcus pyogenes NOT DETECTED NOT DETECTED Final   A.calcoaceticus-baumannii NOT DETECTED NOT DETECTED Final   Bacteroides fragilis NOT DETECTED NOT DETECTED Final   Enterobacterales NOT DETECTED NOT DETECTED Final   Enterobacter cloacae complex NOT DETECTED NOT DETECTED Final   Escherichia coli NOT DETECTED NOT DETECTED Final   Klebsiella aerogenes NOT DETECTED NOT DETECTED Final   Klebsiella oxytoca NOT DETECTED NOT DETECTED Final   Klebsiella pneumoniae NOT DETECTED NOT DETECTED Final   Proteus species NOT DETECTED NOT DETECTED Final   Salmonella species NOT DETECTED NOT DETECTED Final   Serratia marcescens NOT DETECTED NOT DETECTED Final   Haemophilus influenzae NOT DETECTED NOT DETECTED Final   Neisseria meningitidis NOT DETECTED NOT DETECTED Final   Pseudomonas aeruginosa NOT DETECTED NOT DETECTED Final   Stenotrophomonas maltophilia NOT DETECTED NOT DETECTED Final   Candida albicans NOT DETECTED  NOT DETECTED Final   Candida auris NOT DETECTED NOT DETECTED Final   Candida glabrata NOT DETECTED NOT DETECTED Final   Candida krusei NOT DETECTED NOT DETECTED Final   Candida parapsilosis NOT DETECTED NOT DETECTED Final   Candida tropicalis NOT DETECTED NOT DETECTED Final   Cryptococcus neoformans/gattii NOT DETECTED NOT DETECTED Final    Comment: Performed at [facility], [address] [place], [place] [address]  Blood Culture (routine x 2)     Status: None (Preliminary result)   Collection Time: 12/20/22  1:33 AM   Specimen: BLOOD  Result Value Ref Range Status   Specimen Description BLOOD BLOOD RIGHT ARM  Final   Special Requests   Final    BOTTLES DRAWN AEROBIC AND ANAEROBIC Blood Culture adequate volume   Culture   Final    NO GROWTH  2 DAYS Performed at [facility], [address], [place], [place] [address]    Report Status PENDING  Incomplete  Blood Culture (routine x 2)     Status: None (Preliminary result)   Collection Time: 12/20/22  1:50 AM   Specimen: Right Antecubital; Blood  Result Value Ref Range Status   Specimen Description RIGHT ANTECUBITAL  Final   Special Requests   Final    BOTTLES DRAWN AEROBIC AND ANAEROBIC Blood Culture results may not be optimal due to an excessive volume of blood received in culture bottles   Culture   Final    NO GROWTH 2 DAYS Performed at [facility], [address] [place], [place] [address]    Report Status PENDING  Incomplete     Time coordinating discharge: 35 minutes  SIGNED:   Erick Blinks, DO [facility] 12/22/2022, 9:55 AM  If 7PM-7AM, please contact night-coverage [email or web address]

## 2022-12-23 ENCOUNTER — Telehealth (HOSPITAL_BASED_OUTPATIENT_CLINIC_OR_DEPARTMENT_OTHER): Payer: Self-pay | Admitting: *Deleted

## 2022-12-23 LAB — CULTURE, BLOOD (ROUTINE X 2): Culture: NO GROWTH

## 2022-12-23 NOTE — Telephone Encounter (Signed)
Post ED Visit - Positive Culture Follow-up  Culture report reviewed by antimicrobial stewardship pharmacist: [facility] [address], Pharm.D. []  Celedonio Miyamoto, Pharm.D., BCPS AQ-ID []  Garvin Fila, Pharm.D., BCPS []  Georgina Pillion, Pharm.D., BCPS []  [place], [place].D., BCPS, AAHIVP []  Estella Husk, Pharm.D., BCPS, AAHIVP []  Lysle Pearl, PharmD, BCPS []  Phillips Climes, PharmD, BCPS []  Agapito Games, PharmD, BCPS []  Verlan Friends, PharmD []  Mervyn Gay, PharmD, BCPS [x]  Daylene Posey PharmD  [facility] []  Len Childs, PharmD []  Greer Pickerel, PharmD []  Adalberto Cole, PharmD []  Perlie Gold, Rph []  Lonell Face) Jean Rosenthal, PharmD []  Earl Many, PharmD []  Junita Push, PharmD []  Dorna Leitz, PharmD []  Terrilee Files, PharmD []  Lynann Beaver, PharmD []  Keturah Barre, PharmD []  Loralee Pacas, PharmD []  Bernadene Person, PharmD   Positive blood culture Pt seen by Infectious disease at [facility]. No further action needed  Patsey Berthold 12/23/2022, 9:55 AM

## 2022-12-25 LAB — CULTURE, BLOOD (ROUTINE X 2)

## 2023-01-02 ENCOUNTER — Ambulatory Visit (INDEPENDENT_AMBULATORY_CARE_PROVIDER_SITE_OTHER): Payer: No Typology Code available for payment source | Admitting: "Endocrinology

## 2023-01-02 ENCOUNTER — Encounter: Payer: Self-pay | Admitting: "Endocrinology

## 2023-01-02 VITALS — BP 128/84 | HR 84 | Ht 67.0 in | Wt 304.2 lb

## 2023-01-02 DIAGNOSIS — I10 Essential (primary) hypertension: Secondary | ICD-10-CM | POA: Diagnosis not present

## 2023-01-02 DIAGNOSIS — Z794 Long term (current) use of insulin: Secondary | ICD-10-CM

## 2023-01-02 DIAGNOSIS — E782 Mixed hyperlipidemia: Secondary | ICD-10-CM | POA: Diagnosis not present

## 2023-01-02 DIAGNOSIS — E1169 Type 2 diabetes mellitus with other specified complication: Secondary | ICD-10-CM | POA: Diagnosis not present

## 2023-01-02 MED ORDER — EMPAGLIFLOZIN 10 MG PO TABS
10.0000 mg | ORAL_TABLET | Freq: Every day | ORAL | 1 refills | Status: DC
Start: 2023-01-02 — End: 2023-05-16

## 2023-01-02 NOTE — Patient Instructions (Signed)
                                     Advice for Weight Management  -For most of us the best way to lose weight is by diet management. Generally speaking, diet management means consuming less calories intentionally which over time brings about progressive weight loss.  This can be achieved more effectively by avoiding ultra processed carbohydrates, processed meats, unhealthy fats.    It is critically important to know your numbers: how much calorie you are consuming and how much calorie you need. More importantly, our carbohydrates sources should be unprocessed naturally occurring  complex starch food items.  It is always important to balance nutrition also by  appropriate intake of proteins (mainly plant-based), healthy fats/oils, plenty of fruits and vegetables.   -The American College of Lifestyle Medicine (ACL M) recommends nutrition derived mostly from Whole Food, Plant Predominant Sources example an apple instead of applesauce or apple pie. Eat Plenty of vegetables, Mushrooms, fruits, Legumes, Whole Grains, Nuts, seeds in lieu of processed meats, processed snacks/pastries red meat, poultry, eggs.  Use only water or unsweetened tea for hydration.  The College also recommends the need to stay away from risky substances including alcohol, smoking; obtaining 7-9 hours of restorative sleep, at least 150 minutes of moderate intensity exercise weekly, importance of healthy social connections, and being mindful of stress and seek help when it is overwhelming.    -Sticking to a routine mealtime to eat 3 meals a day and avoiding unnecessary snacks is shown to have a big role in weight control. Under normal circumstances, the only time we burn stored energy is when we are hungry, so allow  some hunger to take place- hunger means no food between appropriate meal times, only water.  It is not advisable to starve.   -It is better to avoid simple carbohydrates including:  Cakes, Sweet Desserts, Ice Cream, Soda (diet and regular), Sweet Tea, Candies, Chips, Cookies, Store Bought Juices, Alcohol in Excess of  1-2 drinks a day, Lemonade,  Artificial Sweeteners, Doughnuts, Coffee Creamers, "Sugar-free" Products, etc, etc.  This is not a complete list.....    -Consulting with certified diabetes educators is proven to provide you with the most accurate and current information on diet.  Also, you may be  interested in discussing diet options/exchanges , we can schedule a visit with Jonathan Sellers, RDN, CDE for individualized nutrition education.  -Exercise: If you are able: 30 -60 minutes a day ,4 days a week, or 150 minutes of moderate intensity exercise weekly.    The longer the better if tolerated.  Combine stretch, strength, and aerobic activities.  If you were told in the past that you have high risk for cardiovascular diseases, or if you are currently symptomatic, you may seek evaluation by your heart doctor prior to initiating moderate to intense exercise programs.                                  Additional Care Considerations for Diabetes/Prediabetes   -Diabetes  is a chronic disease.  The most important care consideration is regular follow-up with your diabetes care provider with the goal being avoiding or delaying its complications and to take advantage of advances in medications and technology.  If appropriate actions are taken early enough, type 2 diabetes can even be   reversed.  Seek information from the right source.  - Whole Food, Plant Predominant Nutrition is highly recommended: Eat Plenty of vegetables, Mushrooms, fruits, Legumes, Whole Grains, Nuts, seeds in lieu of processed meats, processed snacks/pastries red meat, poultry, eggs as recommended by American College of  Lifestyle Medicine (ACLM).  -Type 2 diabetes is known to coexist with other important comorbidities such as high blood pressure and high cholesterol.  It is critical to control not only the  diabetes but also the high blood pressure and high cholesterol to minimize and delay the risk of complications including coronary artery disease, stroke, amputations, blindness, etc.  The good news is that this diet recommendation for type 2 diabetes is also very helpful for managing high cholesterol and high blood blood pressure.  - Studies showed that people with diabetes will benefit from a class of medications known as ACE inhibitors and statins.  Unless there are specific reasons not to be on these medications, the standard of care is to consider getting one from these groups of medications at an optimal doses.  These medications are generally considered safe and proven to help protect the heart and the kidneys.    - People with diabetes are encouraged to initiate and maintain regular follow-up with eye doctors, foot doctors, dentists , and if necessary heart and kidney doctors.     - It is highly recommended that people with diabetes quit smoking or stay away from smoking, and get yearly  flu vaccine and pneumonia vaccine at least every 5 years.  See above for additional recommendations on exercise, sleep, stress management , and healthy social connections.      

## 2023-01-02 NOTE — Progress Notes (Signed)
Endocrinology Consult Note       01/02/2023, 12:04 PM   Subjective:    Patient ID: Jonathan Sellers, male    DOB: [birth year].  Jonathan Sellers is being seen in consultation for management of currently uncontrolled symptomatic diabetes requested by  Benetta Spar, MD.   Past Medical History:  Diagnosis Date   Diabetes (HCC)    HLD (hyperlipidemia)    HTN (hypertension)    Sleep apnea     Past Surgical History:  Procedure Laterality Date   COLONOSCOPY WITH PROPOFOL N/A 07/12/2020   Procedure: COLONOSCOPY WITH PROPOFOL;  Surgeon: Lanelle Bal, DO;  [facility]: [facility];  Service: Endoscopy;  Laterality: N/A;  8:00am   POLYPECTOMY  07/12/2020   Procedure: POLYPECTOMY;  Surgeon: Lanelle Bal, DO;  Location: [facility];  Service: Endoscopy;;   WISDOM TOOTH EXTRACTION      Social History   Socioeconomic History   Marital status: Married    Spouse name: Not on file   Number of children: Not on file   Years of education: Not on file   Highest education level: Not on file  Occupational History   Not on file  Tobacco Use   Smoking status: Never   Smokeless tobacco: Never  Vaping Use   Vaping Use: Never used  Substance and Sexual Activity   Alcohol use: No   Drug use: No   Sexual activity: Not on file  Other Topics Concern   Not on file  Social History Narrative   Not on file   Social Determinants of Health   Financial Resource Strain: Not on file  Food Insecurity: No Food Insecurity (12/20/2022)   Hunger Vital Sign    Worried About Running Out of Food in the Last Year: Never true    Ran Out of Food in the Last Year: Never true  Transportation Needs: No Transportation Needs (12/20/2022)   PRAPARE - Administrator, Civil Service (Medical): No    Lack of Transportation (Non-Medical): No  Physical Activity: Not on file  Stress: Not on file  Social Connections:  Not on file    Family History  Problem Relation Age of Onset   Colon cancer Neg Hx     Outpatient Encounter Medications as of 01/02/2023  Medication Sig   empagliflozin (JARDIANCE) 10 MG TABS tablet Take 1 tablet (10 mg total) by mouth daily before breakfast.   acetaminophen (TYLENOL) 500 MG tablet Take 1,000 mg by mouth 2 (two) times daily as needed for moderate pain or headache.   ASPIRIN LOW DOSE 81 MG tablet Take 81 mg by mouth daily.   atorvastatin (LIPITOR) 20 MG tablet Take 40 mg by mouth daily.   Continuous Glucose Receiver (DEXCOM G7 RECEIVER) DEVI Use as instructed.   Continuous Glucose Sensor (DEXCOM G7 SENSOR) MISC Use as instructed.   LANTUS SOLOSTAR 100 UNIT/ML Solostar Pen Inject 70 Units into the skin at bedtime.   lisinopril-hydrochlorothiazide (ZESTORETIC) 20-12.5 MG tablet Take 1 tablet by mouth 2 (two) times daily.   metFORMIN (GLUCOPHAGE-XR) 500 MG 24 hr tablet Take 1 tablet (500 mg total)  by mouth daily with breakfast.   OVER THE COUNTER MEDICATION Apply 1 application topically daily as needed (pain). CBD cream   potassium chloride (KLOR-CON) 10 MEQ tablet Take 10 mEq by mouth daily.   sodium chloride (OCEAN) 0.65 % SOLN nasal spray Place 1 spray into both nostrils as needed for congestion.   torsemide (DEMADEX) 20 MG tablet Take 20 mg by mouth daily.   [DISCONTINUED] glipiZIDE (GLUCOTROL) 10 MG tablet Take 10 mg by mouth 2 (two) times daily before a meal.   No facility-administered encounter medications on file as of 01/02/2023.    ALLERGIES: No Known Allergies  VACCINATION STATUS: Immunization History  Administered Date(s) Administered   Moderna Sars-Covid-2 Vaccination 10/31/2019, 12/02/2019    Diabetes He presents for his follow-up diabetic visit. He has type 2 diabetes mellitus. His disease course has been improving. There are no hypoglycemic associated symptoms. Pertinent negatives for hypoglycemia include no confusion, headaches, pallor or seizures.  Associated symptoms include blurred vision, polydipsia and polyuria. Pertinent negatives for diabetes include no chest pain, no fatigue, no polyphagia and no weakness. There are no hypoglycemic complications. Symptoms are improving. (Morbid obesity, comorbid hyperlipidemia and hypertension) Risk factors for coronary artery disease include dyslipidemia, diabetes mellitus, hypertension, male sex, obesity and sedentary lifestyle. Current diabetic treatments: He is currently on Lantus 60 units nightly, metformin 500 mg p.o. daily, glipizide 10 mg p.o. twice daily. His weight is decreasing steadily. He is following a generally unhealthy diet. When asked about meal planning, he reported none. He has not had a previous visit with a dietitian. He never participates in exercise. His home blood glucose trend is decreasing steadily. His breakfast blood glucose range is generally 180-200 mg/dl. His lunch blood glucose range is generally 180-200 mg/dl. His dinner blood glucose range is generally 180-200 mg/dl. His bedtime blood glucose range is generally 180-200 mg/dl. His overall blood glucose range is 180-200 mg/dl. (He returns with his logbook showing significantly improved glycemic profile.  He was recently treated for lower extremity cellulitis and severe hypertension as an inpatient.  His recent A1c was 14%.   ) An ACE inhibitor/angiotensin II receptor blocker is being taken. Eye exam is not current.  Hypertension This is a chronic problem. The current episode started more than 1 year ago. The problem has been rapidly worsening since onset. The problem is uncontrolled. Associated symptoms include blurred vision. Pertinent negatives include no chest pain, headaches, neck pain, palpitations or shortness of breath. Risk factors for coronary artery disease include dyslipidemia, diabetes mellitus, obesity and male gender. Past treatments include ACE inhibitors.  Hyperlipidemia This is a chronic problem. The current episode  started more than 1 year ago. Pertinent negatives include no chest pain, myalgias or shortness of breath. Current antihyperlipidemic treatment includes statins. Risk factors for coronary artery disease include dyslipidemia, diabetes mellitus, hypertension, male sex and a sedentary lifestyle.     Review of Systems  Constitutional:  Negative for chills, fatigue, fever and unexpected weight change.  HENT:  Negative for dental problem, mouth sores and trouble swallowing.   Eyes:  Positive for blurred vision. Negative for visual disturbance.  Respiratory:  Negative for cough, choking, chest tightness, shortness of breath and wheezing.   Cardiovascular:  Negative for chest pain, palpitations and leg swelling.  Gastrointestinal:  Negative for abdominal distention, abdominal pain, constipation, diarrhea, nausea and vomiting.  Endocrine: Positive for polydipsia and polyuria. Negative for polyphagia.  Genitourinary:  Negative for dysuria, flank pain, hematuria and urgency.  Musculoskeletal:  Negative for back  pain, gait problem, myalgias and neck pain.  Skin:  Negative for pallor, rash and wound.  Neurological:  Negative for seizures, syncope, weakness, numbness and headaches.  Psychiatric/Behavioral:  Negative for confusion and dysphoric mood.     Objective:       01/02/2023    9:48 AM 12/22/2022    8:35 AM 12/22/2022    5:12 AM  Vitals with BMI  Height 5\' 7"     Weight 304 lbs 3 oz    BMI 47.63    Systolic 128 137 098  Diastolic 84 98 84  Pulse 84  91    BP 128/84   Pulse 84   Ht 5\' 7"  (1.702 m)   Wt (!) 304 lb 3.2 oz (138 kg)   BMI 47.64 kg/m   Wt Readings from Last 3 Encounters:  01/02/23 (!) 304 lb 3.2 oz (138 kg)  12/20/22 (!) 306 lb 14.4 oz (139.2 kg)  12/19/22 (!) 310 lb 13.6 oz (141 kg)      CMP ( most recent) CMP     Component Value Date/Time   NA 134 (L) 12/21/2022 0410   K 3.7 12/21/2022 0410   CL 102 12/21/2022 0410   CO2 24 12/21/2022 0410   GLUCOSE 258 (H)  12/21/2022 0410   BUN 12 12/21/2022 0410   CREATININE 0.75 12/21/2022 0410   CALCIUM 9.1 12/21/2022 0410   PROT 5.8 (L) 12/21/2022 0410   ALBUMIN 2.6 (L) 12/21/2022 0410   AST 13 (L) 12/21/2022 0410   ALT 32 12/21/2022 0410   ALKPHOS 85 12/21/2022 0410   BILITOT 0.7 12/21/2022 0410   GFRNONAA >60 12/21/2022 0410   GFRAA >60 03/11/2015 1520     Diabetic Labs (most recent): Lab Results  Component Value Date   HGBA1C 14.3 (H) 12/20/2022   HGBA1C 14.0 (A) 12/19/2022     Assessment & Plan:   1. Type 2 diabetes mellitus with other specified complication, with long-term current use of insulin (HCC)  - DEVANE GRANNAN has currently uncontrolled symptomatic type 2 DM since  52 years of age,  with most recent A1c of 14%. Recent labs reviewed. He returns with his logbook showing significantly improved glycemic profile.  He was recently treated for lower extremity cellulitis and severe hypertension as an inpatient.  - I had a long discussion with him about the possible risk factors and  the pathology behind its diabetes and its complications. -his diabetes is complicated by obesity/sedentary life, comorbid hypertension hyperlipidemia, large edematous lower extremity/lymphedema, and he remains at a high risk for more acute and chronic complications which include CAD, CVA, CKD, retinopathy, and neuropathy. These are all discussed in detail with him.  - I discussed all available options of managing his diabetes including de-escalation of medications. I have counseled him on Food as Medicine by adopting a Whole Food , Plant Predominant  ( WFPP) nutrition as recommended by [employer] of Lifestyle Medicine. Patient is encouraged to switch to  unprocessed or minimally processed  complex starch, adequate protein intake (mainly plant source), minimal liquid fat, plenty of fruits, and vegetables. -  he is advised to stick to a routine mealtimes to eat 3 complete meals a day and snack only when necessary  ( to snack only to correct hypoglycemia BG <70 day time or <100 at night).   - he acknowledges that there is a room for improvement in his food and drink choices. - Suggestion is made for him to avoid simple carbohydrates  from his  diet including Cakes, Sweet Desserts, Ice Cream, Soda (diet and regular), Sweet Tea, Candies, Chips, Cookies, Store Bought Juices, Alcohol , Artificial Sweeteners,  Coffee Creamer, and "Sugar-free" Products, Lemonade. This will help patient to have more stable blood glucose profile and potentially avoid unintended weight gain.  The following Lifestyle Medicine recommendations according to American College of Lifestyle Medicine  [facility]) were discussed and and offered to patient and he  agrees to start the journey:  A. Whole Foods, Plant-Based Nutrition comprising of fruits and vegetables, plant-based proteins, whole-grain carbohydrates was discussed in detail with the patient.   A list for source of those nutrients were also provided to the patient.  Patient will use only water or unsweetened tea for hydration. B.  The need to stay away from risky substances including alcohol, smoking; obtaining 7 to 9 hours of restorative sleep, at least 150 minutes of moderate intensity exercise weekly, the importance of healthy social connections,  and stress management techniques were discussed. C.  A full color page of  Calorie density of various food groups per pound showing examples of each food groups was provided to the patient.   - he will be scheduled with Norm Salt, RDN, CDE for individualized diabetes education.  - I have approached him with the following individualized plan to manage  his diabetes and patient agrees:   -In light of his engagement, he will not be considered for prandial insulin for now.  I discussed and increase his Lantus to 70 units nightly, advised to use his Dexcom continuously.   I discussed and discontinued his glipizide.  He would benefit from  SGLT2 inhibitor, discussed and prescribed Jardiance 10 mg p.o. daily at breakfast.   - Specific targets for  A1c;  LDL, HDL,  and Triglycerides were discussed with the patient.  2) Blood Pressure /Hypertension:   -His blood pressure is controlled today.  He is advised to continue his Zestoretic 20-12.5 mg p.o. twice daily, Demadex 20 mg p.o. daily,   3) Lipids/Hyperlipidemia: No recent lipid panel to review.  He is advised to continue Lipitor 20 mg p.o. nightly.    4)  Weight/Diet:  Body mass index is 47.64 kg/m.  -   clearly complicating his diabetes care.   he is  a candidate for weight loss. I discussed with him the fact that loss of 5 - 10% of his  current body weight will have the most impact on his diabetes management.  The above detailed  ACLM recommendations for nutrition, exercise, sleep, social life, avoidance of risky substances, the need for restorative sleep   information will also detailed on discharge instructions.  5) Chronic Care/Health Maintenance:  -he  is on ACEI/ARB and Statin medications and  is encouraged to initiate and continue to follow up with Ophthalmology, Dentist,  Podiatrist at least yearly or according to recommendations, and advised to   stay away from smoking. I have recommended yearly flu vaccine and pneumonia vaccine at least every 5 years; moderate intensity exercise for up to 150 minutes weekly; and  sleep for 7- 9 hours a day.  - he is  advised to maintain close follow up with Benetta Spar, MD for primary care needs, as well as his other providers for optimal and coordinated care.   I spent  41 minutes in the care of the patient today including review of labs from CMP, Lipids, Thyroid Function, Hematology (current and previous including abstractions from other facilities); face-to-face time discussing  his blood glucose  readings/logs, discussing hypoglycemia and hyperglycemia episodes and symptoms, medications doses, his options of short and long  term treatment based on the latest standards of care / guidelines;  discussion about incorporating lifestyle medicine;  and documenting the encounter. Risk reduction counseling performed per USPSTF guidelines to reduce  obesity and cardiovascular risk factors.     Please refer to Patient Instructions for Blood Glucose Monitoring and Insulin/Medications Dosing Guide"  in media tab for additional information. Please  also refer to " Patient Self Inventory" in the Media  tab for reviewed elements of pertinent patient history.  Meyer Cory participated in the discussions, expressed understanding, and voiced agreement with the above plans.  All questions were answered to his satisfaction. he is encouraged to contact clinic should he have any questions or concerns prior to his return visit.   Follow up plan: - Return in about 3 months (around 04/04/2023) for Bring Meter/CGM Device/Logs- A1c in Office.  Marquis Lunch, MD [facility] [address] [place], [place] [address] Phone: [phone]  Fax: [phone]    01/02/2023, 12:04 PM  This note was partially dictated with voice recognition software. Similar sounding words can be transcribed inadequately or may not  be corrected upon review.

## 2023-01-28 ENCOUNTER — Other Ambulatory Visit: Payer: Self-pay | Admitting: *Deleted

## 2023-01-28 ENCOUNTER — Telehealth: Payer: Self-pay | Admitting: "Endocrinology

## 2023-01-28 NOTE — Telephone Encounter (Signed)
Pt is requesting a new refill on his Metformin to be sent to [facility] on [address]

## 2023-01-28 NOTE — Telephone Encounter (Signed)
Started to send this in. It looks like another physician sent this in for the patient. Will share with Dr. Isidoro Donning nurse 01/29/2023.

## 2023-01-31 ENCOUNTER — Encounter: Payer: No Typology Code available for payment source | Attending: Internal Medicine | Admitting: Nutrition

## 2023-01-31 VITALS — Ht 67.0 in | Wt 298.0 lb

## 2023-01-31 DIAGNOSIS — Z794 Long term (current) use of insulin: Secondary | ICD-10-CM | POA: Insufficient documentation

## 2023-01-31 DIAGNOSIS — E1165 Type 2 diabetes mellitus with hyperglycemia: Secondary | ICD-10-CM | POA: Diagnosis present

## 2023-01-31 DIAGNOSIS — I10 Essential (primary) hypertension: Secondary | ICD-10-CM | POA: Diagnosis present

## 2023-01-31 DIAGNOSIS — E782 Mixed hyperlipidemia: Secondary | ICD-10-CM | POA: Insufficient documentation

## 2023-01-31 NOTE — Progress Notes (Signed)
Medical Nutrition Therapy  Appointment Start time:  1430  Appointment End time:  [phone] Primary concerns today: DM Type 2 , Obesity  Referral diagnosis: E11.8, E66.01 Preferred learning style: NO Preference . Learning readiness: Ready    NUTRITION ASSESSMENT  52 yr old bmale referred for uncontrolled type 2 DM and obesity. PCP Dr. Felecia Shelling.He has recently started making better choices to improve his BS's. He was drinking a lot of sodas and eating a lot of fast foods. FBS: 190-198 mg/dl, Evenings.. 200's. A1C 14.3% 70 units Lantus daily. Jardiance and Metformin 500 mg ER once a day. He is on hold for pradin insulin right now.  Wants to get a CGM to help him understand his BS and manage his DM better. Dexcom not covered and was very expensive.  Will see if the Josephine Igo is covered. Lantus 70 units, Metformin and Jardiance and needs refills. Also trying to figure what he needs to do about his Torsemide.  BS are doing better. He is willing to work on eating more whole plant based foods to improve his BS and health. He wants to lose weight also. Willing to start walking some or using some not weigh bearing exercises to get moving.  He would benefit greatly from a CGM to better asses his BS control throughout the day and make better lifestyle choices.    Anthropometrics  Wt Readings from Last 3 Encounters:  01/31/23 298 lb (135.2 kg)  01/02/23 (!) 304 lb 3.2 oz (138 kg)  12/20/22 (!) 306 lb 14.4 oz (139.2 kg)   Ht Readings from Last 3 Encounters:  01/31/23 5\' 7"  (1.702 m)  01/02/23 5\' 7"  (1.702 m)  12/20/22 5\' 7"  (1.702 m)   Body mass index is 46.67 kg/m. @BMIFA @ Facility age limit for growth %iles is 20 years. Facility age limit for growth %iles is 20 years.    Clinical Medical Hx: See chart Medications: See chart Labs:  Lab Results  Component Value Date   HGBA1C 14.3 (H) 12/20/2022      Latest Ref Rng & Units 12/21/2022    4:10 AM 12/20/2022    1:33 AM 12/19/2022   11:01 AM   CMP  Glucose 70 - 99 mg/dL 540  981  191   BUN 6 - 20 mg/dL 12  15  16    Creatinine 0.61 - 1.24 mg/dL 4.78  2.95  6.21   Sodium 135 - 145 mmol/L 134  135  137   Potassium 3.5 - 5.1 mmol/L 3.7  4.4  3.7   Chloride 98 - 111 mmol/L 102  103  102   CO2 22 - 32 mmol/L 24  22  26    Calcium 8.9 - 10.3 mg/dL 9.1  9.6  9.8   Total Protein 6.5 - 8.1 g/dL 5.8  6.7    Total Bilirubin 0.3 - 1.2 mg/dL 0.7  0.8    Alkaline Phos 38 - 126 U/L 85  122    AST 15 - 41 U/L 13  25    ALT 0 - 44 U/L 32  45     Lipid Panel  No results found for: "CHOL", "TRIG", "HDL", "CHOLHDL", "VLDL", "LDLCALC", "LDLDIRECT", "LABVLDL"  Notable Signs/Symptoms: Increased thirst, frequent urination, fatigue, headaches, dizziness at hime  Lifestyle & Dietary Hx LIves with his wife. Eats 1-2 meals per day. Usually has been fast foods and a lot of sodas. Recently changed to eating better.  Estimated daily fluid intake: 40 oz Supplements:  Sleep: poor -6 hrs Stress /  self-care: no problems Current average weekly physical activity: ADL  24-Hr Dietary Recall Has been eating fast food and processed foods Working on changing it.  Estimated Energy Needs Calories: 1800 Carbohydrate: 200g Protein: 135g Fat: 50g   NUTRITION DIAGNOSIS  NI-1.7 Predicted excessive energy intake As related to Uncontrolled Type 2 DM, Obesity.  As evidenced by A1C 14.3% and BMI 46.   NUTRITION INTERVENTION  Nutrition education (E-1) on the following topics:  Nutrition and Diabetes education provided on My Plate, CHO counting, meal planning, portion sizes, timing of meals, avoiding snacks between meals unless having a low blood sugar, target ranges for A1C and blood sugars, signs/symptoms and treatment of hyper/hypoglycemia, monitoring blood sugars, taking medications as prescribed, benefits of exercising 30 minutes per day and prevention of complications of DM.  Lifestyle Medicine  - Whole Food, Plant Predominant Nutrition is highly  recommended: Eat Plenty of vegetables, Mushrooms, fruits, Legumes, Whole Grains, Nuts, seeds in lieu of processed meats, processed snacks/pastries red meat, poultry, eggs.    -It is better to avoid simple carbohydrates including: Cakes, Sweet Desserts, Ice Cream, Soda (diet and regular), Sweet Tea, Candies, Chips, Cookies, Store Bought Juices, Alcohol in Excess of  1-2 drinks a day, Lemonade,  Artificial Sweeteners, Doughnuts, Coffee Creamers, "Sugar-free" Products, etc, etc.  This is not a complete list.....  Exercise: If you are able: 30 -60 minutes a day ,4 days a week, or 150 minutes a week.  The longer the better.  Combine stretch, strength, and aerobic activities.  If you were told in the past that you have high risk for cardiovascular diseases, you may seek evaluation by your heart doctor prior to initiating moderate to intense exercise programs.    Handouts Provided Include  Lifestyle Medicine My Plate Know your numbers.  Learning Style & Readiness for Change Teaching method utilized: Visual & Auditory  Demonstrated degree of understanding via: Teach Back  Barriers to learning/adherence to lifestyle change: None  Goals Established by Pt Focus on whole plant based foods Eat meals on time Drink water only Avoid snacks  Check blood sugars twice a day. Get A1c down to 7% or less. Call your insurance to find out if they cover the CGM [employer]   MONITORING & EVALUATION Dietary intake, weekly physical activity, and weight and BS's in 1 month.  Next Steps  Patient is to work on eating more whole plant based foods and drinking water. Marland Kitchen

## 2023-01-31 NOTE — Patient Instructions (Addendum)
Focus on whole plant based foods Eat meals on time Drink water only Avoid snacks  Check blood sugars twice a day. Get A1c down to 7% or less. Call your insurance to find out if they cover the CGM [employer] [place] [employer]

## 2023-02-04 ENCOUNTER — Encounter: Payer: Self-pay | Admitting: Nutrition

## 2023-02-05 ENCOUNTER — Other Ambulatory Visit: Payer: Self-pay

## 2023-02-05 DIAGNOSIS — E1169 Type 2 diabetes mellitus with other specified complication: Secondary | ICD-10-CM

## 2023-02-05 MED ORDER — FREESTYLE LIBRE 3 SENSOR MISC
2 refills | Status: AC
Start: 2023-02-05 — End: ?

## 2023-02-05 MED ORDER — FREESTYLE LIBRE 3 READER DEVI
1.0000 | 0 refills | Status: AC
Start: 2023-02-05 — End: ?

## 2023-02-14 ENCOUNTER — Other Ambulatory Visit (HOSPITAL_COMMUNITY): Payer: Self-pay

## 2023-04-02 ENCOUNTER — Ambulatory Visit: Payer: No Typology Code available for payment source | Admitting: Nutrition

## 2023-04-04 ENCOUNTER — Ambulatory Visit: Payer: No Typology Code available for payment source | Admitting: "Endocrinology

## 2023-04-04 ENCOUNTER — Encounter: Payer: No Typology Code available for payment source | Attending: Internal Medicine | Admitting: Nutrition

## 2023-04-04 VITALS — Ht 67.0 in | Wt 297.8 lb

## 2023-04-04 DIAGNOSIS — E782 Mixed hyperlipidemia: Secondary | ICD-10-CM | POA: Diagnosis present

## 2023-04-04 DIAGNOSIS — Z794 Long term (current) use of insulin: Secondary | ICD-10-CM | POA: Diagnosis present

## 2023-04-04 DIAGNOSIS — I10 Essential (primary) hypertension: Secondary | ICD-10-CM | POA: Insufficient documentation

## 2023-04-04 DIAGNOSIS — E1165 Type 2 diabetes mellitus with hyperglycemia: Secondary | ICD-10-CM | POA: Insufficient documentation

## 2023-04-04 NOTE — Progress Notes (Signed)
Medical Nutrition Therapy  Appointment Start time:  1430  Appointment End time:  1530 Primary concerns today: DM Type 2 , Obesity  Referral diagnosis: E11.8, E66.01 Preferred learning style: No Preference . Learning readiness: Ready    NUTRITION ASSESSMENT Changes made:  Cut out soda, sweet tea and a lot of processed foods.  Has been eating chicken, veggies and a lot more fruit, Feels a lot better. Sleeping, digestion and energy level has improved a lot. Doesn't have the swelling in his feet and hands. No longer bloated or feeling sluggish. Able to breathe and move around a lot better.  Started walking some.  Lantus 70 units at night, Jardiance, Metformin 500 mb BID BS are in the low 180's-220's. He forgot his meter. A1C suppose to be done 8/22 when he comes to see Dr. Fransico Him.  He was prescribed a CGM but it's too expensive for him to afford. He lost 7 lbs   Anthropometrics  Wt Readings from Last 3 Encounters:  04/04/23 297 lb 12.8 oz (135.1 kg)  01/31/23 298 lb (135.2 kg)  01/02/23 (!) 304 lb 3.2 oz (138 kg)   Ht Readings from Last 3 Encounters:  04/04/23 5\' 7"  (1.702 m)  01/31/23 5\' 7"  (1.702 m)  01/02/23 5\' 7"  (1.702 m)   Body mass index is 46.64 kg/m. @BMIFA @ Facility age limit for growth %iles is 20 years. Facility age limit for growth %iles is 20 years.   Clinical Medical Hx: See chart Medications: See chart Labs:  Lab Results  Component Value Date   HGBA1C 14.3 (H) 12/20/2022      Latest Ref Rng & Units 12/21/2022    4:10 AM 12/20/2022    1:33 AM 12/19/2022   11:01 AM  CMP  Glucose 70 - 99 mg/dL 409  811  914   BUN 6 - 20 mg/dL 12  15  16    Creatinine 0.61 - 1.24 mg/dL 7.82  9.56  2.13   Sodium 135 - 145 mmol/L 134  135  137   Potassium 3.5 - 5.1 mmol/L 3.7  4.4  3.7   Chloride 98 - 111 mmol/L 102  103  102   CO2 22 - 32 mmol/L 24  22  26    Calcium 8.9 - 10.3 mg/dL 9.1  9.6  9.8   Total Protein 6.5 - 8.1 g/dL 5.8  6.7    Total Bilirubin 0.3 - 1.2 mg/dL 0.7   0.8    Alkaline Phos 38 - 126 U/L 85  122    AST 15 - 41 U/L 13  25    ALT 0 - 44 U/L 32  45     Lipid Panel  Lipid Panel  No results found for: "CHOL", "TRIG", "HDL", "CHOLHDL", "VLDL", "LDLCALC", "LDLDIRECT", "LABVLDL"   Notable Signs/Symptoms: Increased thirst, frequent urination, fatigue, headaches, dizziness at hime  Lifestyle & Dietary Hx LIves with his wife. Eats 1-2 meals per day. Usually has been fast foods and a lot of sodas. Recently changed to eating better.  Estimated daily fluid intake: 40 oz Supplements:  Sleep: poor -6 hrs Stress / self-care: no problems Current average weekly physical activity: ADL  24-Hr Dietary Recall B) Egg boiled and sardies, water L) Rotisserie chicken, green beans, water fruit D) Chicken, green beans,  water Estimated Energy Needs Calories: 1800 Carbohydrate: 200g Protein: 135g Fat: 50g   NUTRITION DIAGNOSIS  NI-1.7 Predicted excessive energy intake As related to Uncontrolled Type 2 DM, Obesity.  As evidenced by A1C 14.3% and BMI  46.   NUTRITION INTERVENTION  Nutrition education (E-1) on the following topics:  Nutrition and Diabetes education provided on My Plate, CHO counting, meal planning, portion sizes, timing of meals, avoiding snacks between meals unless having a low blood sugar, target ranges for A1C and blood sugars, signs/symptoms and treatment of hyper/hypoglycemia, monitoring blood sugars, taking medications as prescribed, benefits of exercising 30 minutes per day and prevention of complications of DM.  Lifestyle Medicine  - Whole Food, Plant Predominant Nutrition is highly recommended: Eat Plenty of vegetables, Mushrooms, fruits, Legumes, Whole Grains, Nuts, seeds in lieu of processed meats, processed snacks/pastries red meat, poultry, eggs.    -It is better to avoid simple carbohydrates including: Cakes, Sweet Desserts, Ice Cream, Soda (diet and regular), Sweet Tea, Candies, Chips, Cookies, Store Bought Juices,  Alcohol in Excess of  1-2 drinks a day, Lemonade,  Artificial Sweeteners, Doughnuts, Coffee Creamers, "Sugar-free" Products, etc, etc.  This is not a complete list.....  Exercise: If you are able: 30 -60 minutes a day ,4 days a week, or 150 minutes a week.  The longer the better.  Combine stretch, strength, and aerobic activities.  If you were told in the past that you have high risk for cardiovascular diseases, you may seek evaluation by your heart doctor prior to initiating moderate to intense exercise programs.    Handouts Provided Include  Lifestyle Medicine My Plate Know your numbers.  Learning Style & Readiness for Change Teaching method utilized: Visual & Auditory  Demonstrated degree of understanding via: Teach Back  Barriers to learning/adherence to lifestyle change: None  Goals Established by Pt Keep up the great job! Goals am blood sugar 80-130 mg/dl and Bedtime less than 161'W. Be sure to eat 45 grams of carbs per meal. Increase walking to 30 minutes a day. Get A1C down to 7% Increase whole plant based foods of dried beans, peas, lentils and more greens. Lose 2 lbs per month   MONITORING & EVALUATION Dietary intake, weekly physical activity, and weight and BS's in 1 month.  Next Steps  Patient is to work on eating more whole plant based foods and drinking water. Marland Kitchen

## 2023-04-08 ENCOUNTER — Encounter: Payer: Self-pay | Admitting: Nutrition

## 2023-04-08 NOTE — Patient Instructions (Addendum)
Keep up the great job! Goals am blood sugar 80-130 mg/dl and Bedtime less than 086'V. Be sure to eat 45 grams of carbs per meal. Increase walking to 30 minutes a day. Get A1C down to 7% Increase whole plant based foods of dried beans, peas, lentils and more greens. Lose 2 lbs per month

## 2023-04-11 ENCOUNTER — Ambulatory Visit: Payer: No Typology Code available for payment source | Admitting: "Endocrinology

## 2023-04-15 ENCOUNTER — Emergency Department (HOSPITAL_COMMUNITY): Payer: No Typology Code available for payment source

## 2023-04-15 ENCOUNTER — Encounter (HOSPITAL_COMMUNITY): Payer: Self-pay

## 2023-04-15 ENCOUNTER — Other Ambulatory Visit: Payer: Self-pay

## 2023-04-15 ENCOUNTER — Inpatient Hospital Stay (HOSPITAL_COMMUNITY)
Admission: EM | Admit: 2023-04-15 | Discharge: 2023-04-16 | DRG: 309 | Disposition: A | Discharge status: Home / Self Care | Payer: No Typology Code available for payment source | Attending: Internal Medicine | Admitting: Internal Medicine

## 2023-04-15 DIAGNOSIS — I4892 Unspecified atrial flutter: Principal | ICD-10-CM | POA: Diagnosis present

## 2023-04-15 DIAGNOSIS — Z6841 Body Mass Index (BMI) 40.0 and over, adult: Secondary | ICD-10-CM

## 2023-04-15 DIAGNOSIS — Z794 Long term (current) use of insulin: Secondary | ICD-10-CM | POA: Diagnosis not present

## 2023-04-15 DIAGNOSIS — E782 Mixed hyperlipidemia: Secondary | ICD-10-CM | POA: Diagnosis present

## 2023-04-15 DIAGNOSIS — Z7984 Long term (current) use of oral hypoglycemic drugs: Secondary | ICD-10-CM

## 2023-04-15 DIAGNOSIS — E871 Hypo-osmolality and hyponatremia: Secondary | ICD-10-CM | POA: Diagnosis present

## 2023-04-15 DIAGNOSIS — I340 Nonrheumatic mitral (valve) insufficiency: Secondary | ICD-10-CM | POA: Diagnosis not present

## 2023-04-15 DIAGNOSIS — G4733 Obstructive sleep apnea (adult) (pediatric): Secondary | ICD-10-CM | POA: Diagnosis present

## 2023-04-15 DIAGNOSIS — I361 Nonrheumatic tricuspid (valve) insufficiency: Secondary | ICD-10-CM | POA: Diagnosis not present

## 2023-04-15 DIAGNOSIS — I4891 Unspecified atrial fibrillation: Secondary | ICD-10-CM | POA: Diagnosis present

## 2023-04-15 DIAGNOSIS — I509 Heart failure, unspecified: Secondary | ICD-10-CM

## 2023-04-15 DIAGNOSIS — N179 Acute kidney failure, unspecified: Secondary | ICD-10-CM | POA: Diagnosis present

## 2023-04-15 DIAGNOSIS — I5032 Chronic diastolic (congestive) heart failure: Secondary | ICD-10-CM | POA: Diagnosis present

## 2023-04-15 DIAGNOSIS — Z79899 Other long term (current) drug therapy: Secondary | ICD-10-CM | POA: Diagnosis not present

## 2023-04-15 DIAGNOSIS — E119 Type 2 diabetes mellitus without complications: Secondary | ICD-10-CM | POA: Diagnosis not present

## 2023-04-15 DIAGNOSIS — Z7982 Long term (current) use of aspirin: Secondary | ICD-10-CM

## 2023-04-15 DIAGNOSIS — I483 Typical atrial flutter: Secondary | ICD-10-CM | POA: Diagnosis not present

## 2023-04-15 DIAGNOSIS — I110 Hypertensive heart disease with heart failure: Secondary | ICD-10-CM | POA: Diagnosis present

## 2023-04-15 DIAGNOSIS — I10 Essential (primary) hypertension: Secondary | ICD-10-CM | POA: Diagnosis present

## 2023-04-15 DIAGNOSIS — E1165 Type 2 diabetes mellitus with hyperglycemia: Secondary | ICD-10-CM | POA: Diagnosis present

## 2023-04-15 LAB — CBC
HCT: 42.1 % (ref 39.0–52.0)
Hemoglobin: 14.4 g/dL (ref 13.0–17.0)
MCH: 31.8 pg (ref 26.0–34.0)
MCHC: 34.2 g/dL (ref 30.0–36.0)
MCV: 92.9 fL (ref 80.0–100.0)
Platelets: 272 10*3/uL (ref 150–400)
RBC: 4.53 MIL/uL (ref 4.22–5.81)
RDW: 14.9 % (ref 11.5–15.5)
WBC: 8.7 10*3/uL (ref 4.0–10.5)
nRBC: 0 % (ref 0.0–0.2)

## 2023-04-15 LAB — BRAIN NATRIURETIC PEPTIDE: B Natriuretic Peptide: 10 pg/mL (ref 0.0–100.0)

## 2023-04-15 LAB — TROPONIN I (HIGH SENSITIVITY)
Troponin I (High Sensitivity): 12 ng/L (ref <18)
Troponin I (High Sensitivity): 12 ng/L (ref <18)

## 2023-04-15 LAB — T4, FREE: Free T4: 1.02 ng/dL (ref 0.61–1.12)

## 2023-04-15 LAB — MAGNESIUM: Magnesium: 2.3 mg/dL (ref 1.7–2.4)

## 2023-04-15 LAB — BASIC METABOLIC PANEL
Anion gap: 13 (ref 5–15)
BUN: 32 mg/dL — ABNORMAL HIGH (ref 6–20)
CO2: 25 mmol/L (ref 22–32)
Calcium: 9.8 mg/dL (ref 8.9–10.3)
Chloride: 90 mmol/L — ABNORMAL LOW (ref 98–111)
Creatinine, Ser: 1.29 mg/dL — ABNORMAL HIGH (ref 0.61–1.24)
GFR, Estimated: >60 mL/min (ref >60)
Glucose, Bld: 358 mg/dL — ABNORMAL HIGH (ref 70–99)
Potassium: 3.8 mmol/L (ref 3.5–5.1)
Sodium: 128 mmol/L — ABNORMAL LOW (ref 135–145)

## 2023-04-15 LAB — GLUCOSE, CAPILLARY: Glucose-Capillary: 355 mg/dL — ABNORMAL HIGH (ref 70–99)

## 2023-04-15 LAB — CBG MONITORING, ED: Glucose-Capillary: 338 mg/dL — ABNORMAL HIGH (ref 70–99)

## 2023-04-15 LAB — TSH: TSH: 0.858 u[IU]/mL (ref 0.350–4.500)

## 2023-04-15 LAB — MRSA NEXT GEN BY PCR, NASAL: MRSA by PCR Next Gen: NOT DETECTED

## 2023-04-15 MED ORDER — HEPARIN BOLUS VIA INFUSION
4000.0000 [IU] | Freq: Once | INTRAVENOUS | Status: AC
  Administered 2023-04-15: 4000 [IU] via INTRAVENOUS

## 2023-04-15 MED ORDER — METOPROLOL TARTRATE 5 MG/5ML IV SOLN
5.0000 mg | Freq: Once | INTRAVENOUS | Status: AC
  Administered 2023-04-15: 5 mg via INTRAVENOUS
  Filled 2023-04-15: qty 5

## 2023-04-15 MED ORDER — ASPIRIN 81 MG PO TBEC
81.0000 mg | DELAYED_RELEASE_TABLET | Freq: Every day | ORAL | Status: DC
  Administered 2023-04-16: 81 mg via ORAL
  Filled 2023-04-15: qty 1

## 2023-04-15 MED ORDER — FUROSEMIDE 10 MG/ML IJ SOLN: 60.0000 mg | Freq: Once | INTRAMUSCULAR | Status: DC

## 2023-04-15 MED ORDER — ATORVASTATIN CALCIUM 40 MG PO TABS
40.0000 mg | ORAL_TABLET | Freq: Every day | ORAL | Status: DC
  Administered 2023-04-16: 40 mg via ORAL
  Filled 2023-04-15: qty 1

## 2023-04-15 MED ORDER — IOHEXOL 350 MG/ML SOLN
75.0000 mL | Freq: Once | INTRAVENOUS | Status: AC | PRN
  Administered 2023-04-15: 75 mL via INTRAVENOUS

## 2023-04-15 MED ORDER — DILTIAZEM LOAD VIA INFUSION
10.0000 mg | Freq: Once | INTRAVENOUS | Status: AC
  Administered 2023-04-15: 10 mg via INTRAVENOUS
  Filled 2023-04-15: qty 10

## 2023-04-15 MED ORDER — MORPHINE SULFATE (PF) 2 MG/ML IV SOLN: 2.0000 mg | INTRAVENOUS | Status: DC | PRN

## 2023-04-15 MED ORDER — TORSEMIDE 20 MG PO TABS
20.0000 mg | ORAL_TABLET | Freq: Every day | ORAL | Status: DC
  Administered 2023-04-16: 20 mg via ORAL
  Filled 2023-04-15: qty 1

## 2023-04-15 MED ORDER — ONDANSETRON HCL 4 MG/2ML IJ SOLN: 4.0000 mg | Freq: Four times a day (QID) | INTRAMUSCULAR | Status: DC | PRN

## 2023-04-15 MED ORDER — ACETAMINOPHEN 650 MG RE SUPP: 650.0000 mg | Freq: Four times a day (QID) | RECTAL | Status: DC | PRN

## 2023-04-15 MED ORDER — EMPAGLIFLOZIN 10 MG PO TABS
10.0000 mg | ORAL_TABLET | Freq: Every day | ORAL | Status: DC
  Filled 2023-04-15 (×2): qty 1

## 2023-04-15 MED ORDER — DILTIAZEM HCL-DEXTROSE 125-5 MG/125ML-% IV SOLN (PREMIX)
5.0000 mg/h | INTRAVENOUS | Status: DC
  Administered 2023-04-15: 5 mg/h via INTRAVENOUS
  Administered 2023-04-16: 15 mg/h via INTRAVENOUS
  Filled 2023-04-15 (×2): qty 125

## 2023-04-15 MED ORDER — INSULIN ASPART 100 UNIT/ML IJ SOLN
0.0000 [IU] | Freq: Every day | INTRAMUSCULAR | Status: DC
  Administered 2023-04-15: 5 [IU] via SUBCUTANEOUS

## 2023-04-15 MED ORDER — INSULIN GLARGINE-YFGN 100 UNIT/ML ~~LOC~~ SOLN
50.0000 [IU] | Freq: Every day | SUBCUTANEOUS | Status: DC
  Administered 2023-04-15: 50 [IU] via SUBCUTANEOUS
  Filled 2023-04-15 (×2): qty 0.5

## 2023-04-15 MED ORDER — CHLORHEXIDINE GLUCONATE CLOTH 2 % EX PADS
6.0000 | MEDICATED_PAD | Freq: Every day | CUTANEOUS | Status: DC
  Administered 2023-04-15 – 2023-04-16 (×2): 6 via TOPICAL

## 2023-04-15 MED ORDER — SODIUM CHLORIDE 0.9 % IV BOLUS
500.0000 mL | Freq: Once | INTRAVENOUS | Status: AC
  Administered 2023-04-15: 500 mL via INTRAVENOUS

## 2023-04-15 MED ORDER — OXYCODONE HCL 5 MG PO TABS: 5.0000 mg | ORAL_TABLET | ORAL | Status: DC | PRN

## 2023-04-15 MED ORDER — ACETAMINOPHEN 325 MG PO TABS: 650.0000 mg | ORAL_TABLET | Freq: Four times a day (QID) | ORAL | Status: DC | PRN

## 2023-04-15 MED ORDER — INSULIN ASPART 100 UNIT/ML IJ SOLN
0.0000 [IU] | Freq: Three times a day (TID) | INTRAMUSCULAR | Status: DC
  Administered 2023-04-16 (×2): 3 [IU] via SUBCUTANEOUS

## 2023-04-15 MED ORDER — HEPARIN (PORCINE) 25000 UT/250ML-% IV SOLN
1400.0000 [IU]/h | INTRAVENOUS | Status: DC
  Administered 2023-04-15 – 2023-04-16 (×2): 1400 [IU]/h via INTRAVENOUS
  Filled 2023-04-15 (×2): qty 250

## 2023-04-15 MED ORDER — ONDANSETRON HCL 4 MG PO TABS: 4.0000 mg | ORAL_TABLET | Freq: Four times a day (QID) | ORAL | Status: DC | PRN

## 2023-04-15 MED ORDER — SODIUM CHLORIDE 0.9 % IV BOLUS: 1000.0000 mL | Freq: Once | INTRAVENOUS | Status: DC

## 2023-04-15 NOTE — Assessment & Plan Note (Signed)
-  Counseled on diet and exercise.

## 2023-04-15 NOTE — Progress Notes (Signed)
ANTICOAGULATION CONSULT NOTE - Initial Consult  Pharmacy Consult for Heparin Indication: atrial fibrillation  No Known Allergies  Patient Measurements: Height: 5\' 7"  (170.2 cm) Weight: 133.8 kg (295 lb) IBW/kg (Calculated) : 66.1 HEPARIN DW (KG): 98   Vital Signs: Temp: 98.1 F (36.7 C) (08/26 1125) Temp Source: Oral (08/26 1125) BP: 123/85 (08/26 1530) Pulse Rate: 152 (08/26 1530)  Labs: Recent Labs    04/15/23 1125 04/15/23 1159 04/15/23 1429  HGB 14.4  --   --   HCT 42.1  --   --   PLT 272  --   --   CREATININE 1.29*  --   --   TROPONINIHS  --  12 12    Estimated Creatinine Clearance: 87.3 mL/min (A) (by C-G formula based on SCr of 1.29 mg/dL (H)).   Medical History: Past Medical History:  Diagnosis Date   Diabetes (HCC)    HLD (hyperlipidemia)    HTN (hypertension)    Sleep apnea     Medications:  See med rec  Assessment: 52 yo male presents to ED with complaint of elevated HR. Patient with new onset afib. Patient not on any oral anticoagulants. Pharmacy asked to start heparin  Goal of Therapy:  Heparin level 0.3-0.7 units/ml Monitor platelets by anticoagulation protocol: Yes   Plan:  Give 4000 units bolus x 1 Start heparin infusion at 1400 units/hr Check anti-Xa level in 6 hours and daily while on heparin Continue to monitor H&H and platelets  Elder Cyphers, BS Pharm D, BCPS Clinical Pharmacist 04/15/2023,4:11 PM

## 2023-04-15 NOTE — Assessment & Plan Note (Signed)
-   Reports compliance with 70 units of Lantus nightly at home - Glucose currently 358 and reportedly 500 before leaving home today - Continue reduced dose of basal insulin but with sliding scale coverage - Holding metformin - Continue to monitor

## 2023-04-15 NOTE — ED Triage Notes (Signed)
Pt reports he is feeling fine but he had a well check at his PCP office and they told him his heart rate was too fast.

## 2023-04-15 NOTE — Assessment & Plan Note (Addendum)
-   A flutter with a heart rate of 163 in the ED - Failed 2 pushes of metoprolol - Started on Cardizem - Cardiology consulted from the ED and plans to see patient in the a.m. - Continue heparin - Echo in the a.m. - Previous echo shows an ejection fraction of 60% with left ventricular hypertrophy and indeterminate diastolic parameters as well as a possible small PFO and some valvular abnormalities - Troponin normal x 2 - TSH 0.858 - CTA shows no evidence of PE - Continue to monitor

## 2023-04-15 NOTE — Assessment & Plan Note (Signed)
-   Continue statin medication  

## 2023-04-15 NOTE — ED Provider Notes (Signed)
  Physical Exam  BP 123/85   Pulse (!) 152   Temp 98.1 F (36.7 C) (Oral)   Resp 14   Ht 5\' 7"  (1.702 m)   Wt 133.8 kg   SpO2 96%   BMI 46.20 kg/m   Physical Exam Constitutional:      General: He is not in acute distress.    Appearance: Normal appearance.  HENT:     Head: Normocephalic and atraumatic.     Nose: No congestion or rhinorrhea.  Eyes:     General:        Right eye: No discharge.        Left eye: No discharge.     Extraocular Movements: Extraocular movements intact.     Pupils: Pupils are equal, round, and reactive to light.  Cardiovascular:     Rate and Rhythm: Tachycardia present. Rhythm irregular.     Heart sounds: No murmur heard. Pulmonary:     Effort: No respiratory distress.     Breath sounds: No wheezing or rales.  Abdominal:     General: There is no distension.     Tenderness: There is no abdominal tenderness.  Musculoskeletal:        General: Normal range of motion.     Cervical back: Normal range of motion.  Skin:    General: Skin is warm and dry.  Neurological:     General: No focal deficit present.     Mental Status: He is alert.     Procedures  .Critical Care  Performed by: Glendora Score, MD Authorized by: Glendora Score, MD   Critical care provider statement:    Critical care time (minutes):  30   Critical care was necessary to treat or prevent imminent or life-threatening deterioration of the following conditions:  Cardiac failure   Critical care was time spent personally by me on the following activities:  Development of treatment plan with patient or surrogate, discussions with consultants, evaluation of patient's response to treatment, examination of patient, ordering and review of laboratory studies, ordering and review of radiographic studies, ordering and performing treatments and interventions, pulse oximetry, re-evaluation of patient's condition and review of old charts   ED Course / MDM    Medical Decision Making Amount  and/or Complexity of Data Reviewed Labs: ordered. Radiology: ordered.  Risk Prescription drug management. Decision regarding hospitalization.   Patient received in handoff.  Asymptomatic a flutter.  Patient started on diltiazem drip and I spoke with cardiologist on-call who will evaluate the patient inpatient.  Patient admitted       Glendora Score, MD 04/16/23 [id number]

## 2023-04-15 NOTE — Assessment & Plan Note (Signed)
-   Holding antihypertensives given soft pressure down to 97/37 and on Cardizem drip

## 2023-04-15 NOTE — ED Provider Notes (Signed)
[place] EMERGENCY DEPARTMENT AT [facility] Provider Note  CSN: [id number] Arrival date & time: 04/15/23 1108  Chief Complaint(s) Tachycardia  HPI Jonathan Sellers is a 52 y.o. male with past medical history as below, significant for DM HTN OSA obesity who presents to the ED with complaint of elev HR  Pt was at routine PCP visit today, found to have elevated HR 170's, he has no sig chest pain but does report somewhat sleepy, has OSA. Leg swelling roughly unchanged from his prior, perhaps slightly worsened from baseline. No sig dyspnea with exertion or orthopnea, no cp or fevers/chills, no recent med changes, no trauma, no illicit drug use reported   Past Medical History Past Medical History:  Diagnosis Date   Diabetes (HCC)    HLD (hyperlipidemia)    HTN (hypertension)    Sleep apnea    Patient Active Problem List   Diagnosis Date Noted   Cellulitis of left lower extremity 12/20/2022   Hypoalbuminemia due to protein-calorie malnutrition (HCC) 12/20/2022   Type 2 diabetes mellitus with hyperglycemia (HCC) 12/20/2022   Bacteremia 12/20/2022   Diabetes mellitus (HCC) 12/19/2022   Essential hypertension, benign 12/19/2022   Mixed hyperlipidemia 12/19/2022   Morbid obesity (HCC) 12/19/2022   Colon cancer screening 05/26/2020   Home Medication(s) Prior to Admission medications   Medication Sig Start Date End Date Taking? Authorizing Provider  ASPIRIN LOW DOSE 81 MG tablet Take 81 mg by mouth daily. 07/29/22  Yes [provider]  atorvastatin (LIPITOR) 40 MG tablet Take 40 mg by mouth daily. 01/14/23  Yes [provider]  empagliflozin (JARDIANCE) 10 MG TABS tablet Take 1 tablet (10 mg total) by mouth daily before breakfast. 01/02/23  Yes Nida, Denman George, MD  LANTUS SOLOSTAR 100 UNIT/ML Solostar Pen Inject 70 Units into the skin at bedtime. 11/07/22  Yes [provider]  lisinopril-hydrochlorothiazide (ZESTORETIC) 20-12.5 MG tablet Take 1 tablet  by mouth 2 (two) times daily.   Yes [provider]  metFORMIN (GLUCOPHAGE) 500 MG tablet Take 500 mg by mouth 2 (two) times daily. 02/04/23  Yes [provider]  Potassium 99 MG TABS Take 1 tablet by mouth daily.   Yes [provider]  torsemide (DEMADEX) 20 MG tablet Take 20 mg by mouth daily. 07/08/22  Yes [provider]  Continuous Glucose Receiver (DEXCOM G7 RECEIVER) DEVI Use as instructed. 12/22/22   Sherryll Burger, Pratik D, DO  Continuous Glucose Receiver (FREESTYLE LIBRE 3 READER) DEVI 1 each by Other route as directed. Use to check glucose as directed. 02/05/23   Roma Kayser, MD  Continuous Glucose Sensor (FREESTYLE LIBRE 3 SENSOR) MISC Use to check glucose continuously as directed. 02/05/23   Roma Kayser, MD                                                                                                                                    Past  Surgical History Past Surgical History:  Procedure Laterality Date   COLONOSCOPY WITH PROPOFOL N/A 07/12/2020   Procedure: COLONOSCOPY WITH PROPOFOL;  Surgeon: Lanelle Bal, DO;  [facility]: [facility];  Service: Endoscopy;  Laterality: N/A;  8:00am   POLYPECTOMY  07/12/2020   Procedure: POLYPECTOMY;  Surgeon: Lanelle Bal, DO;  Location: [facility];  Service: Endoscopy;;   WISDOM TOOTH EXTRACTION     Family History Family History  Problem Relation Age of Onset   Colon cancer Neg Hx     Social History Social History   Tobacco Use   Smoking status: Never   Smokeless tobacco: Never  Vaping Use   Vaping status: Never Used  Substance Use Topics   Alcohol use: No   Drug use: No   Allergies Patient has no known allergies.  Review of Systems Review of Systems  Constitutional:  Positive for fatigue. Negative for chills and fever.  HENT:  Negative for facial swelling and trouble swallowing.   Eyes:  Negative for photophobia and visual disturbance.  Respiratory:  Negative for  cough and shortness of breath.   Cardiovascular:  Positive for leg swelling. Negative for chest pain and palpitations.  Gastrointestinal:  Negative for abdominal pain, nausea and vomiting.  Endocrine: Negative for polydipsia and polyuria.  Genitourinary:  Negative for difficulty urinating and hematuria.  Musculoskeletal:  Negative for gait problem and joint swelling.  Skin:  Negative for pallor and rash.  Neurological:  Negative for syncope and headaches.  Psychiatric/Behavioral:  Negative for agitation and confusion.     Physical Exam Vital Signs  I have reviewed the triage vital signs BP 123/85   Pulse (!) 152   Temp 98.1 F (36.7 C) (Oral)   Resp 14   Ht 5\' 7"  (1.702 m)   Wt 133.8 kg   SpO2 96%   BMI 46.20 kg/m  Physical Exam Vitals and nursing note reviewed.  Constitutional:      General: He is not in acute distress.    Appearance: He is well-developed. He is obese.  HENT:     Head: Normocephalic and atraumatic.     Right Ear: External ear normal.     Left Ear: External ear normal.     Mouth/Throat:     Mouth: Mucous membranes are moist.  Eyes:     General: No scleral icterus. Cardiovascular:     Rate and Rhythm: Tachycardia present.     Pulses: Normal pulses.     Heart sounds: Normal heart sounds.  Pulmonary:     Effort: Pulmonary effort is normal. No respiratory distress.     Breath sounds: Normal breath sounds.  Abdominal:     General: Abdomen is flat.     Palpations: Abdomen is soft.     Tenderness: There is no abdominal tenderness.  Musculoskeletal:     Cervical back: No rigidity.     Right lower leg: 2+ Pitting Edema present.     Left lower leg: 2+ Pitting Edema present.  Skin:    General: Skin is warm and dry.     Capillary Refill: Capillary refill takes less than 2 seconds.  Neurological:     Mental Status: He is alert and oriented to person, place, and time.     GCS: GCS eye subscore is 4. GCS verbal subscore is 5. GCS motor subscore is 6.   Psychiatric:        Mood and Affect: Mood normal.        Behavior: Behavior  normal.     ED Results and Treatments Labs (all labs ordered are listed, but only abnormal results are displayed) Labs Reviewed  BASIC METABOLIC PANEL - Abnormal; Notable for the following components:      Result Value   Sodium 128 (*)    Chloride 90 (*)    Glucose, Bld 358 (*)    BUN 32 (*)    Creatinine, Ser 1.29 (*)    All other components within normal limits  CBC  TSH  BRAIN NATRIURETIC PEPTIDE  MAGNESIUM  T4, FREE  TROPONIN I (HIGH SENSITIVITY)  TROPONIN I (HIGH SENSITIVITY)                                                                                                                          Radiology CT Angio Chest PE W and/or Wo Contrast  Result Date: 04/15/2023 CLINICAL DATA:  Tachycardia EXAM: CT ANGIOGRAPHY CHEST WITH CONTRAST TECHNIQUE: Multidetector CT imaging of the chest was performed using the standard protocol during bolus administration of intravenous contrast. Multiplanar CT image reconstructions and MIPs were obtained to evaluate the vascular anatomy. RADIATION DOSE REDUCTION: This exam was performed according to the departmental dose-optimization program which includes automated exposure control, adjustment of the mA and/or kV according to patient size and/or use of iterative reconstruction technique. CONTRAST:  75mL OMNIPAQUE IOHEXOL 350 MG/ML SOLN COMPARISON:  None Available. FINDINGS: Cardiovascular: Satisfactory opacification of the pulmonary arteries to the segmental level. No evidence of pulmonary embolism. Mildly enlarged heart size. No pericardial effusion. Mediastinum/Nodes: No enlarged mediastinal, hilar, or axillary lymph nodes. Thyroid gland, trachea, and esophagus demonstrate no significant findings. Lungs/Pleura: No focal consolidation. Mild bilateral interstitial thickening. No pleural effusion or pneumothorax. Upper Abdomen: No acute abnormality. Musculoskeletal: No chest  wall abnormality. No acute or significant osseous findings. Other: Retroareolar fibroglandular tissue as can be seen with gynecomastia. Review of the MIP images confirms the above findings. IMPRESSION: 1. No evidence of pulmonary embolus. 2. Cardiomegaly with mild interstitial edema. Electronically Signed   By: Elige Ko M.D.   On: 04/15/2023 15:02   DG Chest Port 1 View  Result Date: 04/15/2023 CLINICAL DATA:  Atrial fibrillation. EXAM: PORTABLE CHEST 1 VIEW COMPARISON:  Dec 20, 2022. FINDINGS: Stable cardiomegaly.  Lungs are clear.  Bony thorax is unremarkable. IMPRESSION: No active disease. Electronically Signed   By: Lupita Raider M.D.   On: 04/15/2023 12:05    Pertinent labs & imaging results that were available during my care of the patient were reviewed by me and considered in my medical decision making (see MDM for details).  Medications Ordered in ED Medications  diltiazem (CARDIZEM) 1 mg/mL load via infusion 10 mg (has no administration in time range)    And  diltiazem (CARDIZEM) 125 mg in dextrose 5% 125 mL (1 mg/mL) infusion (has no administration in time range)  sodium chloride 0.9 % bolus 500 mL (0 mLs Intravenous Stopped 04/15/23 1320)  metoprolol tartrate (LOPRESSOR) injection 5 mg (5  mg Intravenous Given 04/15/23 1317)  iohexol (OMNIPAQUE) 350 MG/ML injection 75 mL (75 mLs Intravenous Contrast Given 04/15/23 1404)  sodium chloride 0.9 % bolus 500 mL (500 mLs Intravenous New Bag/Given 04/15/23 1518)  metoprolol tartrate (LOPRESSOR) injection 5 mg (5 mg Intravenous Given 04/15/23 1518)                                                                                                                                     Procedures .Critical Care  Performed by: Sloan Leiter, DO Authorized by: Sloan Leiter, DO   Critical care provider statement:    Critical care time (minutes):  42   Critical care time was exclusive of:  Separately billable procedures and treating other  patients   Critical care was necessary to treat or prevent imminent or life-threatening deterioration of the following conditions:  Cardiac failure   Critical care was time spent personally by me on the following activities:  Development of treatment plan with patient or surrogate, discussions with consultants, evaluation of patient's response to treatment, examination of patient, ordering and review of laboratory studies, ordering and review of radiographic studies, ordering and performing treatments and interventions, pulse oximetry, re-evaluation of patient's condition, review of old charts and obtaining history from patient or surrogate   (including critical care time)  Medical Decision Making / ED Course    Medical Decision Making:    Jonathan Sellers is a 52 y.o. male  with past medical history as below, significant for DM HTN OSA obesity who presents to the ED with complaint of elev HR. The complaint involves an extensive differential diagnosis and also carries with it a high risk of complications and morbidity.  Serious etiology was considered. Ddx includes but is not limited to: SVT, afib, arrhythima, thyroid abnormality, metabolic, infectious, PE, etc  Complete initial physical exam performed, notably the patient  was NAD, HR elev 150 - 160's.    Reviewed and confirmed nursing documentation for past medical history, family history, social history.  Vital signs reviewed.    Narrative: 52 yo male hx CHF, DM, HTN here with elevated HR HR elevated, appears to be flutter on EKG, no sig improvement w/ IVF and lopressor Start dilt gtt  Appears to be new onset, was not noted on prior EKG's Chadsvasc 3, will start [facility] Plan admission new onset afib, no clear timing on onset, HDS Hyponatremia on labs, o/w stable, given IVF Pending admission at time of handoff HDS                         Additional history obtained: -Additional history obtained from family -External  records from outside source obtained and reviewed including: Chart review including previous notes, labs, imaging, consultation notes including  Primary care documentation Home meds Prior labs/imaging  Prior echo 5/24 (Dr BrancH) LVEF 55-60%, indeterminate diastolic parameters    Lab Tests: -  I ordered, reviewed, and interpreted labs.   The pertinent results include:   Labs Reviewed  BASIC METABOLIC PANEL - Abnormal; Notable for the following components:      Result Value   Sodium 128 (*)    Chloride 90 (*)    Glucose, Bld 358 (*)    BUN 32 (*)    Creatinine, Ser 1.29 (*)    All other components within normal limits  CBC  TSH  BRAIN NATRIURETIC PEPTIDE  MAGNESIUM  T4, FREE  TROPONIN I (HIGH SENSITIVITY)  TROPONIN I (HIGH SENSITIVITY)    Notable for hypoNa  EKG   EKG Interpretation Date/Time:  Monday April 15 2023 15:33:45 EDT Ventricular Rate:  152 PR Interval:  62 QRS Duration:  126 QT Interval:  344 QTC Calculation: 548 R Axis:   -24  Text Interpretation: Sinus tachycardia Nonspecific intraventricular conduction delay Abnormal T, consider ischemia, diffuse leads ST elevation, consider lateral injury Confirmed by Tanda Rockers ([id number]) on 04/15/2023 3:52:00 PM         Imaging Studies ordered: I ordered imaging studies including CXR CTPE I independently visualized the following imaging with scope of interpretation limited to determining acute life threatening conditions related to emergency care; findings noted above, significant for CHF I independently visualized and interpreted imaging. I agree with the radiologist interpretation   Medicines ordered and prescription drug management: Meds ordered this encounter  Medications   DISCONTD: sodium chloride 0.9 % bolus 1,000 mL   sodium chloride 0.9 % bolus 500 mL   metoprolol tartrate (LOPRESSOR) injection 5 mg   iohexol (OMNIPAQUE) 350 MG/ML injection 75 mL   sodium chloride 0.9 % bolus 500 mL   metoprolol  tartrate (LOPRESSOR) injection 5 mg   AND Linked Order Group    diltiazem (CARDIZEM) 1 mg/mL load via infusion 10 mg    diltiazem (CARDIZEM) 125 mg in dextrose 5% 125 mL (1 mg/mL) infusion    -I have reviewed the patients home medicines and have made adjustments as needed   Consultations Obtained: na   Cardiac Monitoring: The patient was maintained on a cardiac monitor.  I personally viewed and interpreted the cardiac monitored which showed an underlying rhythm of: tachycardia  Social Determinants of Health:  Diagnosis or treatment significantly limited by social determinants of health: obesity   Reevaluation: After the interventions noted above, I reevaluated the patient and found that they have improved  Co morbidities that complicate the patient evaluation  Past Medical History:  Diagnosis Date   Diabetes (HCC)    HLD (hyperlipidemia)    HTN (hypertension)    Sleep apnea       Dispostion: Disposition decision including need for hospitalization was considered, and patient disposition pending at time of sign out.    Final Clinical Impression(s) / ED Diagnoses Final diagnoses:  Chronic congestive heart failure, unspecified heart failure type (HCC)  Atrial flutter, unspecified type (HCC)        Sloan Leiter, DO 04/15/23 1603

## 2023-04-15 NOTE — H&P (Signed)
History and Physical    Patient: Jonathan Sellers DOB: Jan 04, 1971 DOA: 04/15/2023 DOS: the patient was seen and examined on 04/15/2023 PCP: Benetta Spar, MD  Patient coming from: Home  Chief Complaint:  Chief Complaint  Patient presents with   Tachycardia   HPI: Jonathan Sellers is a 52 y.o. male with medical history significant of diabetes mellitus type 2, hyperlipidemia, hypertension, sleep apnea, and more presents the ED with a chief complaint of tachycardia.  Patient reports that he is in a normal state of health at a routine follow-up with his PCP when he was sent here for tachycardia.  Patient reports no chest pain, palpitations, dizziness, near syncope.  He does report dyspnea on exertion.  It started last week.  He says he only feels it occasionally, but his family says he looks dyspneic every time he exerts himself.  He does report fluid buildup and reports that he is on his torsemide.  He says he takes all of his medications as prescribed.  He is not sure when the fluid buildup started.  He has been hyperglycemic as well.  He was supposed to follow-up with Dr. Fransico Him last week, but they rescheduled his appointment because he had not kept up with glucose logs.  He is currently on Lantus 70 units at night.  He is on metformin as well but he reports he has more frequent bowel movements because of that.  His last bowel movement was yesterday.  He needs to have one now and is waiting on bedside commode.  He is post to follow with Dr. Fransico Him every 3 months.  Patient has no other complaints at this time.  Patient is on a fluid pill but does not have a history of CHF.  Echo done in May showed indeterminate diastolic parameters and patient most likely has diastolic CHF.  He is fluid overloaded at this time.  He reports following a low-sodium diet.  Patient does not smoke does not drink and does not use illicit drugs.  Patient is full code. Review of Systems: As mentioned in the  history of present illness. All other systems reviewed and are negative. Past Medical History:  Diagnosis Date   Diabetes (HCC)    HLD (hyperlipidemia)    HTN (hypertension)    Sleep apnea    Past Surgical History:  Procedure Laterality Date   COLONOSCOPY WITH PROPOFOL N/A 07/12/2020   Procedure: COLONOSCOPY WITH PROPOFOL;  Surgeon: Lanelle Bal, DO;  [facility];  Service: Endoscopy;  Laterality: N/A;  8:00am   POLYPECTOMY  07/12/2020   Procedure: POLYPECTOMY;  Surgeon: Lanelle Bal, DO;  Location: [facility];  Service: Endoscopy;;   WISDOM TOOTH EXTRACTION     Social History:  reports that he has never smoked. He has never used smokeless tobacco. He reports that he does not drink alcohol and does not use drugs.  No Known Allergies  Family History  Problem Relation Age of Onset   Colon cancer Neg Hx     Prior to Admission medications   Medication Sig Start Date End Date Taking? Authorizing Provider  ASPIRIN LOW DOSE 81 MG tablet Take 81 mg by mouth daily. 07/29/22  Yes [provider]  atorvastatin (LIPITOR) 40 MG tablet Take 40 mg by mouth daily. 01/14/23  Yes [provider]  empagliflozin (JARDIANCE) 10 MG TABS tablet Take 1 tablet (10 mg total) by mouth daily before breakfast. 01/02/23  Yes Nida, Denman George, MD  LANTUS  SOLOSTAR 100 UNIT/ML Solostar Pen Inject 70 Units into the skin at bedtime. 11/07/22  Yes [provider]  lisinopril-hydrochlorothiazide (ZESTORETIC) 20-12.5 MG tablet Take 1 tablet by mouth 2 (two) times daily.   Yes [provider]  metFORMIN (GLUCOPHAGE) 500 MG tablet Take 500 mg by mouth 2 (two) times daily. 02/04/23  Yes [provider]  Potassium 99 MG TABS Take 1 tablet by mouth daily.   Yes [provider]  torsemide (DEMADEX) 20 MG tablet Take 20 mg by mouth daily. 07/08/22  Yes [provider]  Continuous Glucose Receiver (DEXCOM G7 RECEIVER) DEVI Use as  instructed. 12/22/22   Sherryll Burger, Pratik D, DO  Continuous Glucose Receiver (FREESTYLE LIBRE 3 READER) DEVI 1 each by Other route as directed. Use to check glucose as directed. 02/05/23   Roma Kayser, MD  Continuous Glucose Sensor (FREESTYLE LIBRE 3 SENSOR) MISC Use to check glucose continuously as directed. 02/05/23   Roma Kayser, MD    Physical Exam: Vitals:   04/15/23 1730 04/15/23 1800 04/15/23 1815 04/15/23 1845  BP: 106/76 (!) 114/92 (!) 120/105 (!) 120/103  Pulse: (!) 154 (!) 149 (!) 156 (!) 160  Resp: 19 19 20  (!) 24  Temp:      TempSrc:      SpO2: 95% 93% 95% (!) 87%  Weight:      Height:       1.  General: Patient lying supine in bed,  no acute distress   2. Psychiatric: Alert and oriented x 3, mood and behavior normal for situation, pleasant and cooperative with exam   3. Neurologic: Speech and language are normal, face is symmetric, moves all 4 extremities voluntarily, at baseline without acute deficits on limited exam   4. HEENMT:  Head is atraumatic, normocephalic, pupils reactive to light, neck is supple, trachea is midline, mucous membranes are moist   5. Respiratory : Lungs are clear to auscultation bilaterally without wheezing, rhonchi, rales, no cyanosis, no increase in work of breathing or accessory muscle use   6. Cardiovascular : Heart rate normal, rhythm is regular, no murmurs, rubs or gallops, significant peripheral edema, peripheral pulses palpated   7. Gastrointestinal:  Abdomen is soft, nondistended, nontender to palpation bowel sounds active, no masses or organomegaly palpated   8. Skin:  Skin is warm, dry and intact without rashes, acute lesions, or ulcers on limited exam   9.Musculoskeletal:  No acute deformities or trauma, no asymmetry in tone, significant peripheral edema, peripheral pulses palpated, no tenderness to palpation in the extremities  Data Reviewed: In the ED Temp 98.1, heart rate 142-163, respiratory rate 14-30,  blood pressure 97/37-125/95, satting 83-97% No leukocytosis with a white blood cell count of 8.7, hemoglobin 14.4 Platelets 272 Chemistry reveals an AKI with a creatinine of 1.29 and a baseline at 0.75 Troponin normal x 2 TSH 0.858 CTA shows no evidence of PE Chest x-ray shows no active disease EKG shows a flutter with a heart rate of 152 Since being on the drip in the ER his heart rate has come down to 80 Repeat EKG pending Patient is on Cardizem, heparin He failed 2 pushes of metoprolol Cards was consulted and plans to see patient in the a.m. Continue to monitor   Assessment and Plan: * Atrial flutter (HCC) - A flutter with a heart rate of 163 in the ED - Failed 2 pushes of metoprolol - Started on Cardizem - Cardiology consulted from the ED and plans to see patient in  the a.m. - Continue heparin - Echo in the a.m. - Previous echo shows an ejection fraction of 60% with left ventricular hypertrophy and indeterminate diastolic parameters as well as a possible small PFO and some valvular abnormalities - Troponin normal x 2 - TSH 0.858 - CTA shows no evidence of PE - Continue to monitor  AKI (acute kidney injury) (HCC) - Baseline creatinine 0.75 - Today creatinine 1.29 - Likely cardiorenal as patient is clearly fluid overloaded -1 dose of Lasix 60 mg IV given - Continue home torsemide - Continue to monitor  Type 2 diabetes mellitus with hyperglycemia (HCC) - Reports compliance with 70 units of Lantus nightly at home - Glucose currently 358 and reportedly 500 before leaving home today - Continue reduced dose of basal insulin but with sliding scale coverage - Holding metformin - Continue to monitor  Morbid obesity (HCC) - Counseled on diet and exercise  Mixed hyperlipidemia - Continue statin medication  Essential hypertension, benign - Holding antihypertensives given soft pressure down to 97/37 and on Cardizem drip      Advance Care Planning:   Code Status: Full  Code  Consults: Cardio GI  Family Communication: Wife and son at bedside  Severity of Illness: The appropriate patient status for this patient is INPATIENT. Inpatient status is judged to be reasonable and necessary in order to provide the required intensity of service to ensure the patient's safety. The patient's presenting symptoms, physical exam findings, and initial radiographic and laboratory data in the context of their chronic comorbidities is felt to place them at high risk for further clinical deterioration. Furthermore, it is not anticipated that the patient will be medically stable for discharge from the hospital within 2 midnights of admission.   * I certify that at the point of admission it is my clinical judgment that the patient will require inpatient hospital care spanning beyond 2 midnights from the point of admission due to high intensity of service, high risk for further deterioration and high frequency of surveillance required.*  Author: Lilyan Gilford, DO 04/15/2023 7:02 PM  For on call review [email or web address].

## 2023-04-15 NOTE — Assessment & Plan Note (Signed)
-   Baseline creatinine 0.75 - Today creatinine 1.29 - Likely cardiorenal as patient is clearly fluid overloaded -1 dose of Lasix 60 mg IV given - Continue home torsemide - Continue to monitor

## 2023-04-16 ENCOUNTER — Encounter (HOSPITAL_COMMUNITY)
Admission: EM | Disposition: A | Discharge status: Home / Self Care | Payer: Self-pay | Source: Home / Self Care | Attending: Family Medicine

## 2023-04-16 ENCOUNTER — Encounter (HOSPITAL_COMMUNITY): Payer: Self-pay | Admitting: Family Medicine

## 2023-04-16 ENCOUNTER — Inpatient Hospital Stay (HOSPITAL_COMMUNITY): Payer: No Typology Code available for payment source | Admitting: Certified Registered Nurse Anesthetist

## 2023-04-16 ENCOUNTER — Other Ambulatory Visit (HOSPITAL_COMMUNITY): Payer: Self-pay

## 2023-04-16 ENCOUNTER — Inpatient Hospital Stay (HOSPITAL_COMMUNITY): Payer: No Typology Code available for payment source

## 2023-04-16 DIAGNOSIS — I361 Nonrheumatic tricuspid (valve) insufficiency: Secondary | ICD-10-CM | POA: Diagnosis not present

## 2023-04-16 DIAGNOSIS — E119 Type 2 diabetes mellitus without complications: Secondary | ICD-10-CM | POA: Diagnosis not present

## 2023-04-16 DIAGNOSIS — I340 Nonrheumatic mitral (valve) insufficiency: Secondary | ICD-10-CM

## 2023-04-16 DIAGNOSIS — I4892 Unspecified atrial flutter: Secondary | ICD-10-CM

## 2023-04-16 DIAGNOSIS — I483 Typical atrial flutter: Secondary | ICD-10-CM | POA: Diagnosis not present

## 2023-04-16 DIAGNOSIS — Z794 Long term (current) use of insulin: Secondary | ICD-10-CM

## 2023-04-16 DIAGNOSIS — I10 Essential (primary) hypertension: Secondary | ICD-10-CM

## 2023-04-16 HISTORY — PX: CARDIOVERSION: SHX1299

## 2023-04-16 LAB — COMPREHENSIVE METABOLIC PANEL
ALT: 45 U/L — ABNORMAL HIGH (ref 0–44)
AST: 24 U/L (ref 15–41)
Albumin: 3.4 g/dL — ABNORMAL LOW (ref 3.5–5.0)
Alkaline Phosphatase: 92 U/L (ref 38–126)
Anion gap: 10 (ref 5–15)
BUN: 26 mg/dL — ABNORMAL HIGH (ref 6–20)
CO2: 26 mmol/L (ref 22–32)
Calcium: 9.8 mg/dL (ref 8.9–10.3)
Chloride: 96 mmol/L — ABNORMAL LOW (ref 98–111)
Creatinine, Ser: 0.98 mg/dL (ref 0.61–1.24)
GFR, Estimated: >60 mL/min (ref >60)
Glucose, Bld: 243 mg/dL — ABNORMAL HIGH (ref 70–99)
Potassium: 4.1 mmol/L (ref 3.5–5.1)
Sodium: 132 mmol/L — ABNORMAL LOW (ref 135–145)
Total Bilirubin: 0.8 mg/dL (ref 0.3–1.2)
Total Protein: 6.2 g/dL — ABNORMAL LOW (ref 6.5–8.1)

## 2023-04-16 LAB — CBC WITH DIFFERENTIAL/PLATELET
Abs Immature Granulocytes: 0.09 10*3/uL — ABNORMAL HIGH (ref 0.00–0.07)
Basophils Absolute: 0.1 10*3/uL (ref 0.0–0.1)
Basophils Relative: 1 %
Eosinophils Absolute: 0.1 10*3/uL (ref 0.0–0.5)
Eosinophils Relative: 1 %
HCT: 41.6 % (ref 39.0–52.0)
Hemoglobin: 14 g/dL (ref 13.0–17.0)
Immature Granulocytes: 1 %
Lymphocytes Relative: 17 %
Lymphs Abs: 1.3 10*3/uL (ref 0.7–4.0)
MCH: 32.2 pg (ref 26.0–34.0)
MCHC: 33.7 g/dL (ref 30.0–36.0)
MCV: 95.6 fL (ref 80.0–100.0)
Monocytes Absolute: 0.5 10*3/uL (ref 0.1–1.0)
Monocytes Relative: 6 %
Neutro Abs: 5.7 10*3/uL (ref 1.7–7.7)
Neutrophils Relative %: 74 %
Platelets: 267 10*3/uL (ref 150–400)
RBC: 4.35 MIL/uL (ref 4.22–5.81)
RDW: 15.2 % (ref 11.5–15.5)
WBC: 7.7 10*3/uL (ref 4.0–10.5)
nRBC: 0 % (ref 0.0–0.2)

## 2023-04-16 LAB — GLUCOSE, CAPILLARY
Glucose-Capillary: 218 mg/dL — ABNORMAL HIGH (ref 70–99)
Glucose-Capillary: 197 mg/dL — ABNORMAL HIGH (ref 70–99)

## 2023-04-16 LAB — TSH: TSH: 0.234 u[IU]/mL — ABNORMAL LOW (ref 0.350–4.500)

## 2023-04-16 LAB — MAGNESIUM: Magnesium: 2.5 mg/dL — ABNORMAL HIGH (ref 1.7–2.4)

## 2023-04-16 LAB — ECHO TEE: Est EF: 50

## 2023-04-16 LAB — HEPARIN LEVEL (UNFRACTIONATED): Heparin Unfractionated: 0.44 [IU]/mL (ref 0.30–0.70)

## 2023-04-16 SURGERY — CARDIOVERSION
Anesthesia: General

## 2023-04-16 MED ORDER — APIXABAN 5 MG PO TABS: 5.0000 mg | ORAL_TABLET | Freq: Two times a day (BID) | ORAL | 2 refills | Status: DC

## 2023-04-16 MED ORDER — METOPROLOL TARTRATE 5 MG/5ML IV SOLN
2.5000 mg | Freq: Once | INTRAVENOUS | Status: AC
  Administered 2023-04-16: 2.5 mg via INTRAVENOUS
  Filled 2023-04-16: qty 5

## 2023-04-16 MED ORDER — LACTATED RINGERS IV SOLN: INTRAVENOUS | Status: DC | PRN

## 2023-04-16 MED ORDER — LISINOPRIL 10 MG PO TABS: 10.0000 mg | ORAL_TABLET | Freq: Every day | ORAL | Status: DC

## 2023-04-16 MED ORDER — APIXABAN 5 MG PO TABS
5.0000 mg | ORAL_TABLET | Freq: Two times a day (BID) | ORAL | Status: DC
  Administered 2023-04-16: 5 mg via ORAL
  Filled 2023-04-16: qty 1

## 2023-04-16 MED ORDER — BUTAMBEN-TETRACAINE-BENZOCAINE 2-2-14 % EX AERO
INHALATION_SPRAY | CUTANEOUS | Status: DC | PRN
Start: 2023-04-16 — End: 2023-04-16
  Administered 2023-04-16: 2 via TOPICAL

## 2023-04-16 MED ORDER — PROPOFOL 500 MG/50ML IV EMUL
INTRAVENOUS | Status: AC
  Filled 2023-04-16: qty 100

## 2023-04-16 MED ORDER — METOPROLOL TARTRATE 25 MG PO TABS: 25.0000 mg | ORAL_TABLET | Freq: Two times a day (BID) | ORAL | 1 refills | Status: DC

## 2023-04-16 MED ORDER — PROPOFOL 10 MG/ML IV BOLUS
INTRAVENOUS | Status: DC | PRN
  Administered 2023-04-16: 60 mg via INTRAVENOUS
  Administered 2023-04-16: 40 mg via INTRAVENOUS

## 2023-04-16 MED ORDER — PROPOFOL 500 MG/50ML IV EMUL
INTRAVENOUS | Status: DC | PRN
  Administered 2023-04-16: 75 ug/kg/min via INTRAVENOUS

## 2023-04-16 MED ORDER — BUTAMBEN-TETRACAINE-BENZOCAINE 2-2-14 % EX AERO
INHALATION_SPRAY | CUTANEOUS | Status: AC
  Filled 2023-04-16: qty 5

## 2023-04-16 MED ORDER — LISINOPRIL 10 MG PO TABS: 10.0000 mg | ORAL_TABLET | Freq: Every day | ORAL | 1 refills | Status: DC

## 2023-04-16 MED ORDER — METOPROLOL TARTRATE 25 MG PO TABS: 25.0000 mg | ORAL_TABLET | Freq: Two times a day (BID) | ORAL | Status: DC

## 2023-04-16 NOTE — Progress Notes (Signed)
ANTICOAGULATION CONSULT NOTE  Pharmacy Consult for Heparin Indication: atrial fibrillation Brief A/P: Heparin level within goal range Continue Heparin at current rate  No Known Allergies  Patient Measurements: Height: 5\' 7"  (170.2 cm) Weight: 127.9 kg (281 lb 15.5 oz) IBW/kg (Calculated) : 66.1 HEPARIN DW (KG): 96.6   Vital Signs: Temp: 98.3 F (36.8 C) (08/27 0453) Temp Source: Oral (08/27 0453) BP: 107/50 (08/27 0630) Pulse Rate: 163 (08/27 0630)  Labs: Recent Labs    04/15/23 1125 04/15/23 1159 04/15/23 1429 04/16/23 0446  HGB 14.4  --   --  14.0  HCT 42.1  --   --  41.6  PLT 272  --   --  267  HEPARINUNFRC  --   --   --  0.44  CREATININE 1.29*  --   --  0.98  TROPONINIHS  --  12 12  --     Estimated Creatinine Clearance: 112 mL/min (by C-G formula based on SCr of 0.98 mg/dL).  Assessment: 52 y.o. male with Afib for heparin  Goal of Therapy:  Heparin level 0.3-0.7 units/ml Monitor platelets by anticoagulation protocol: Yes   Plan:  No change to heparin  Geannie Risen, PharmD, BCPS  04/16/2023,6:37 AM

## 2023-04-16 NOTE — Progress Notes (Signed)
Maintaining SR after DCCV this morning, feeling well. Ok for discharge on lopressor 25mg  bid and eliquis 5mg  bid. SBP in low 100s, he also presented with AKI and low sodium. I would stop his home lisinopril/hydrochlorothiazide and just start lisinopril by itself at lower dose of 10mg . Can continue home torsemide for now, if recurrent labs changes may need to lower diuretic dosing. Can titrate bp meds at f/u if high bp's. Would also obtain an outpatient TTE at f/u if he maintains SR, somewhatl limited LVEF evaluation by TEE in setting of aflutter with higher rates, grossly looks to be low normal around 50%. Will need bmet/mg arranged at f/u.  -d/c his home ASA since on eliquis.   We will sign off inpatient care and arrange f/u.   Dominga Ferry MD

## 2023-04-16 NOTE — Progress Notes (Signed)
  Echocardiogram Echocardiogram Transesophageal has been performed.  Jonathan Sellers 04/16/2023, 11:46 AM

## 2023-04-16 NOTE — Discharge Instructions (Signed)

## 2023-04-16 NOTE — Progress Notes (Signed)
   04/16/23 1125  TOC Brief Assessment  Insurance and Status Reviewed  Patient has primary care physician Yes  Home environment has been reviewed from home  Prior level of function: Independent  Prior/Current Home Services No current home services  Social Determinants of Health Reivew SDOH reviewed no interventions necessary  Readmission risk has been reviewed Yes  Transition of care needs no transition of care needs at this time    Transition of Care Department [facility]) has reviewed patient and no TOC needs have been identified at this time. We will continue to monitor patient advancement through interdisciplinary progression rounds. If new patient transition needs arise, please place a TOC consult.

## 2023-04-16 NOTE — CV Procedure (Signed)
CV Procedure Note  Procedure: Transesophageal echocardiogram/electrical cardioversion Indication: Atrial flutter Physician: Dr Dina Rich    Patient was brought to the procedure suite after appropriate consent was obtained. The posterior oropharynx was anesthesized with cetacaine spray. Sedation managed by anesthesiology, please refer to there documentation. Bite block placed and patient placed in the left lateral decubitis position. TEE probe intubated into the esophagus without difficultly and several views obtained. Please see full TEE report for findings, no evidence of intracardiac thrombus.   TEE probe was removed, defib pads had been placed in the anterior and posterior positions. He was succesfully electrically cardioverted to sinus rhythm in the 70s with a single 200j synchronized shock. Cardiopulmonary monitoring performed throughout the procedure, he tolerated well without complications   Dilt gtt discontinued post procedure, follow rates today and likely add oral agent.    Dina Rich MD

## 2023-04-16 NOTE — Inpatient Diabetes Management (Signed)
Inpatient Diabetes Program Recommendations  AACE/ADA: New Consensus Statement on Inpatient Glycemic Control   Target Ranges:  Prepandial:   less than 140 mg/dL      Peak postprandial:   less than 180 mg/dL (1-2 hours)      Critically ill patients:  140 - 180 mg/dL    Latest Reference Range & Units 04/15/23 18:58 04/15/23 21:07 04/16/23 07:34  Glucose-Capillary 70 - 99 mg/dL 604 (H) 540 (H) 981 (H)    Latest Reference Range & Units 04/15/23 11:25  CO2 22 - 32 mmol/L 25  Glucose 70 - 99 mg/dL 191 (H)  Anion gap 5 - 15  13    Latest Reference Range & Units 12/20/22 01:42  Hemoglobin A1C 4.8 - 5.6 % 14.3 (H)   Review of Glycemic Control  Diabetes history: DM2 Outpatient Diabetes medications: Lantus 70 units at bedtime, Jardiance 10 mg QAM, Metformin XR 500 mg QAM Current orders for Inpatient glycemic control: Semglee 50 units at bedtime, Novolog 0-15 units TID with meals, Novolog 0-5 units QHS  Inpatient Diabetes Program Recommendations:    Insulin: Please consider ordering Novolog 3 units TID with meals for meal coverage if patient eats at least 50% of meals.  HbgA1C: Last A1C 14.3% on 12/20/22 indicating an average glucose of 364 mg/dl.  NOTE: Patient admitted with a-flutter and AKI with initial lab glucose of 358 mg/dl on 4/78/29. Patient was last inpatient 5/2-12/22/22 and was seen by inpatient diabetes coordinator on 12/21/22 while inpatient. Per chart review, patient seen Dr. Fransico Him (Endocrinologist) on 01/02/23 and patient was asked to increase Lantus 70 units QHS, was prescribed Jardiance 10 mg daily and Glipizide was discontinued. Will follow along while inpatient.  Thanks, Orlando Penner, RN, MSN, CDCES Diabetes Coordinator Inpatient Diabetes Program [phone] (Team Pager from 8am to 5pm)

## 2023-04-16 NOTE — Consult Note (Addendum)
Cardiology Consultation   Patient ID: Jonathan Sellers MRN: [id number]; DOB: 1971-03-20  Admit date: 04/15/2023 Date of Consult: 04/16/2023  PCP:  Benetta Spar, MD    HeartCare Providers Cardiologist:  New to HeartCare  Patient Profile:   Jonathan Sellers is a 52 y.o. male with a hx of OSA, HTN, HLD and Type 2 DM who is being seen 04/16/2023 for the evaluation of atrial flutter with RVR at the request of Dr. Carren Rang.  History of Present Illness:   Jonathan Sellers presented to [facility] ED on 04/15/2023 after being evaluated by his PCP and found to be tachycardic with heart rate in the 170's. In talking with the patient today, he reports he has noticed more shortness of breath over the past 1-2 weeks but thought this was just due to being outside. Says it was noticeable enough though that family members commented on it. He denies any chest pain or palpitations. No recent orthopnea or PND. Has chronic lower extremity edema and takes Torsemide 20mg  daily. Says he was diagnosed with sleep apnea last year but was unable to obtain a CPAP due to insurance changes. He does not consume alcohol or caffeine. Denies any tobacco use or recreational drug use. No known family history of cardiac arrhythmias.   Initial labs showed WBC 8.7, Hgb 14.4, platelets 272, Na+ 128, K+ 3.8 and creatinine 1.29. Magnesium 2.3. Glucose elevated to 358. TSH normal at 0.858.  BNP at 10. Initial and repeat Hs troponin values negative at 12. CXR with no active disease. CTA showed no evidence of a PE but was noted to have cardiomegaly with mild interstitial edema. EKG showed atrial flutter with RVR, heart rate 161 with diffuse ST elevation along the inferior and lateral leads.  He was started on IV Cardizem along with IV Heparin. IV Lasix 60mg  was ordered but not administered given he had taken Torsemide at home with a recorded net output of over 3L. He was tachycardiac this morning with HR in the 160's and received  IV Lopressor 2.5mg .   Past Medical History:  Diagnosis Date   Diabetes (HCC)    HLD (hyperlipidemia)    HTN (hypertension)    Sleep apnea     Past Surgical History:  Procedure Laterality Date   COLONOSCOPY WITH PROPOFOL N/A 07/12/2020   Procedure: COLONOSCOPY WITH PROPOFOL;  Surgeon: Lanelle Bal, DO;  [facility];  Service: Endoscopy;  Laterality: N/A;  8:00am   POLYPECTOMY  07/12/2020   Procedure: POLYPECTOMY;  Surgeon: Lanelle Bal, DO;  Location: [facility];  Service: Endoscopy;;   WISDOM TOOTH EXTRACTION       Home Medications:  Prior to Admission medications   Medication Sig Start Date End Date Taking? Authorizing Provider  ASPIRIN LOW DOSE 81 MG tablet Take 81 mg by mouth daily. 07/29/22  Yes [provider]  atorvastatin (LIPITOR) 40 MG tablet Take 40 mg by mouth daily. 01/14/23  Yes [provider]  empagliflozin (JARDIANCE) 10 MG TABS tablet Take 1 tablet (10 mg total) by mouth daily before breakfast. 01/02/23  Yes Nida, Denman George, MD  LANTUS SOLOSTAR 100 UNIT/ML Solostar Pen Inject 70 Units into the skin at bedtime. 11/07/22  Yes [provider]  lisinopril-hydrochlorothiazide (ZESTORETIC) 20-12.5 MG tablet Take 1 tablet by mouth 2 (two) times daily.   Yes [provider]  metFORMIN (GLUCOPHAGE) 500 MG tablet Take 500 mg by mouth 2 (two) times daily. 02/04/23  Yes [provider]  Potassium 99 MG TABS Take 1 tablet by mouth daily.   Yes [provider]  torsemide (DEMADEX) 20 MG tablet Take 20 mg by mouth daily. 07/08/22  Yes [provider]  Continuous Glucose Receiver (DEXCOM G7 RECEIVER) DEVI Use as instructed. 12/22/22   Sherryll Burger, Pratik D, DO  Continuous Glucose Receiver (FREESTYLE LIBRE 3 READER) DEVI 1 each by Other route as directed. Use to check glucose as directed. 02/05/23   Roma Kayser, MD  Continuous Glucose Sensor (FREESTYLE LIBRE 3 SENSOR) MISC Use to check glucose  continuously as directed. 02/05/23   Roma Kayser, MD    Inpatient Medications: Scheduled Meds:  aspirin EC  81 mg Oral Daily   atorvastatin  40 mg Oral Daily   Chlorhexidine Gluconate Cloth  6 each Topical Daily   furosemide  60 mg Intravenous Once   insulin aspart  0-15 Units Subcutaneous TID WC   insulin aspart  0-5 Units Subcutaneous QHS   insulin glargine-yfgn  50 Units Subcutaneous QHS   torsemide  20 mg Oral Daily   Continuous Infusions:  diltiazem (CARDIZEM) infusion 15 mg/hr (04/16/23 0656)   heparin 1,400 Units/hr (04/16/23 0654)   PRN Meds: acetaminophen **OR** acetaminophen, morphine injection, ondansetron **OR** ondansetron (ZOFRAN) IV, oxyCODONE  Allergies:   No Known Allergies  Social History:   Social History   Socioeconomic History   Marital status: Married    Spouse name: Not on file   Number of children: Not on file   Years of education: Not on file   Highest education level: Not on file  Occupational History   Not on file  Tobacco Use   Smoking status: Never   Smokeless tobacco: Never  Vaping Use   Vaping status: Never Used  Substance and Sexual Activity   Alcohol use: No   Drug use: No   Sexual activity: Not on file  Other Topics Concern   Not on file  Social History Narrative   Not on file   Social Determinants of Health   Financial Resource Strain: Not on file  Food Insecurity: No Food Insecurity (04/15/2023)   Hunger Vital Sign    Worried About Running Out of Food in the Last Year: Never true    Ran Out of Food in the Last Year: Never true  Transportation Needs: No Transportation Needs (04/15/2023)   PRAPARE - Administrator, Civil Service (Medical): No    Lack of Transportation (Non-Medical): No  Physical Activity: Not on file  Stress: Not on file  Social Connections: Not on file  Intimate Partner Violence: Not At Risk (04/15/2023)   Humiliation, Afraid, Rape, and Kick questionnaire    Fear of Current or  Ex-Partner: No    Emotionally Abused: No    Physically Abused: No    Sexually Abused: No    Family History:    Family History  Problem Relation Age of Onset   Colon cancer Neg Hx      ROS:  Please see the history of present illness.   All other ROS reviewed and negative.     Physical Exam/Data:   Vitals:   04/16/23 0453 04/16/23 0500 04/16/23 0630 04/16/23 0730  BP:  109/83 (!) 107/50   Pulse:  83 (!) 163   Resp:  14 (!) 22   Temp: 98.3 F (36.8 C)   98 F (36.7 C)  TempSrc: Oral   Oral  SpO2:  92% 94%   Weight: 127.9 kg  Height:        Intake/Output Summary (Last 24 hours) at 04/16/2023 0912 Last data filed at 04/16/2023 0400 Gross per 24 hour  Intake 266.22 ml  Output 3400 ml  Net -3133.78 ml      04/16/2023    4:53 AM 04/15/2023    9:14 PM 04/15/2023   11:22 AM  Last 3 Weights  Weight (lbs) 281 lb 15.5 oz 285 lb 0.9 oz 295 lb  Weight (kg) 127.9 kg 129.3 kg 133.811 kg     Body mass index is 44.16 kg/m.  General:  Well nourished, well developed obese male appearing in no acute distress. HEENT: normal Neck: JVD difficult to assess secondary to body habitus.  Vascular: No carotid bruits; Distal pulses 2+ bilaterally Cardiac:  normal S1, S2; Irregularly irregular.  Lungs:  clear to auscultation bilaterally, no wheezing, rhonchi or rales  Abd: soft, nontender, no hepatomegaly  Ext: chronic appearing edema Musculoskeletal:  No deformities, BUE and BLE strength normal and equal Skin: warm and dry  Neuro:  CNs 2-12 intact, no focal abnormalities noted Psych:  Normal affect   EKG:  The EKG was personally reviewed and demonstrates: Atrial flutter with RVR, heart rate 161 with diffuse ST elevation along the inferior and lateral leads.  Telemetry:  Telemetry was personally reviewed and demonstrates: Atrial flutter with variable block, HR in 80's to 90's, peaking into 160's at times.   Relevant CV Studies:  Echocardiogram: 12/2022 IMPRESSIONS     1. Left  ventricular ejection fraction, by estimation, is 55 to 60%. The  left ventricle has normal function. The left ventricle has no regional  wall motion abnormalities. There is moderate left ventricular hypertrophy.  Left ventricular diastolic  parameters are indeterminate.   2. Right ventricular systolic function is normal. The right ventricular  size is normal. Tricuspid regurgitation signal is inadequate for assessing  PA pressure.   3. Possible small PFO with mild left to right shunt.   4. The mitral valve is normal in structure. Trivial mitral valve  regurgitation. No evidence of mitral stenosis.   5. The tricuspid valve is abnormal.   6. The aortic valve has an indeterminant number of cusps. Aortic valve  regurgitation is not visualized. No aortic stenosis is present.   7. Aortic dilatation noted. There is mild dilatation of the ascending  aorta, measuring 36 mm.   8. The inferior vena cava is dilated in size with >50% respiratory  variability, suggesting right atrial pressure of 8 mmHg.   Laboratory Data:  High Sensitivity Troponin:   Recent Labs  Lab 04/15/23 1159 04/15/23 1429  TROPONINIHS 12 12     Chemistry Recent Labs  Lab 04/15/23 1125 04/16/23 0446  NA 128* 132*  K 3.8 4.1  CL 90* 96*  CO2 25 26  GLUCOSE 358* 243*  BUN 32* 26*  CREATININE 1.29* 0.98  CALCIUM 9.8 9.8  MG 2.3 2.5*  GFRNONAA >60 >60  ANIONGAP 13 10    Recent Labs  Lab 04/16/23 0446  PROT 6.2*  ALBUMIN 3.4*  AST 24  ALT 45*  ALKPHOS 92  BILITOT 0.8   Lipids No results for input(s): "CHOL", "TRIG", "HDL", "LABVLDL", "LDLCALC", "CHOLHDL" in the last 168 hours.  Hematology Recent Labs  Lab 04/15/23 1125 04/16/23 0446  WBC 8.7 7.7  RBC 4.53 4.35  HGB 14.4 14.0  HCT 42.1 41.6  MCV 92.9 95.6  MCH 31.8 32.2  MCHC 34.2 33.7  RDW 14.9 15.2  PLT 272 267  Thyroid  Recent Labs  Lab 04/15/23 1653 04/16/23 0446  TSH  --  0.234*  FREET4 1.02  --     BNP Recent Labs  Lab  04/15/23 1125  BNP 10.0    DDimer No results for input(s): "DDIMER" in the last 168 hours.   Radiology/Studies:  CT Angio Chest PE W and/or Wo Contrast  Result Date: 04/15/2023 CLINICAL DATA:  Tachycardia EXAM: CT ANGIOGRAPHY CHEST WITH CONTRAST TECHNIQUE: Multidetector CT imaging of the chest was performed using the standard protocol during bolus administration of intravenous contrast. Multiplanar CT image reconstructions and MIPs were obtained to evaluate the vascular anatomy. RADIATION DOSE REDUCTION: This exam was performed according to the departmental dose-optimization program which includes automated exposure control, adjustment of the mA and/or kV according to patient size and/or use of iterative reconstruction technique. CONTRAST:  75mL OMNIPAQUE IOHEXOL 350 MG/ML SOLN COMPARISON:  None Available. FINDINGS: Cardiovascular: Satisfactory opacification of the pulmonary arteries to the segmental level. No evidence of pulmonary embolism. Mildly enlarged heart size. No pericardial effusion. Mediastinum/Nodes: No enlarged mediastinal, hilar, or axillary lymph nodes. Thyroid gland, trachea, and esophagus demonstrate no significant findings. Lungs/Pleura: No focal consolidation. Mild bilateral interstitial thickening. No pleural effusion or pneumothorax. Upper Abdomen: No acute abnormality. Musculoskeletal: No chest wall abnormality. No acute or significant osseous findings. Other: Retroareolar fibroglandular tissue as can be seen with gynecomastia. Review of the MIP images confirms the above findings. IMPRESSION: 1. No evidence of pulmonary embolus. 2. Cardiomegaly with mild interstitial edema. Electronically Signed   By: Elige Ko M.D.   On: 04/15/2023 15:02   DG Chest Port 1 View  Result Date: 04/15/2023 CLINICAL DATA:  Atrial fibrillation. EXAM: PORTABLE CHEST 1 VIEW COMPARISON:  Dec 20, 2022. FINDINGS: Stable cardiomegaly.  Lungs are clear.  Bony thorax is unremarkable. IMPRESSION: No active  disease. Electronically Signed   By: Lupita Raider M.D.   On: 04/15/2023 12:05     Assessment and Plan:   1. Atrial Flutter with RVR - New diagnosis for the patient this admission and unclear duration as he reports 1-2 weeks of worsening dyspnea but no chest pain or palpitations (was in NSR during admission in 12/2022). TSH was checked twice for unclear reasons and normal at 0.858 initially but low at 0.234 on recheck. Free T4 is normal though.  - Given his variable HR from the 80's to 160's despite being on IV Cardizem and receiving intermittent doses of IV Lopressor. Previously reviewed with Dr. Wyline Mood and will plan for TEE/DCCV today pending schedule availability. He only had a sip of Coke this morning and no breakfast. Did take Jardiance yesterday at 0400 and will make anesthesia aware. Currently on IV Heparin and will transition to Eliquis following his procedure. He may require adjustments in GDMT if found to have a cardiomyopathy.   Shared Decision Making/Informed Consent{ The risks [stroke, cardiac arrhythmias rarely resulting in the need for a temporary or permanent pacemaker, skin irritation or burns, esophageal damage, perforation (1:10,000 risk), bleeding, pharyngeal hematoma as well as other potential complications associated with conscious sedation including aspiration, arrhythmia, respiratory failure and death], benefits (treatment guidance, restoration of normal sinus rhythm, diagnostic support) and alternatives of a transesophageal echocardiogram guided cardioversion were discussed in detail with Mr. Chipley and he is willing to proceed.     2. HTN - BP has been low-normal since being on IV Cardizem, at 107/50 on most recent check. Remains on IV Cardizem. PTA Lisinopril-Hydrochlorothiazide has been held to allow for titration of  AV nodal blocking agents. Given his hyponatremia, would not restart Hydrochlorothiazide at discharge since he is also on Torsemide 20mg  daily at home.   3.  HLD - Followed by his PCP. He has been continued on Atorvastatin 40mg  daily.   4. OSA - Was previously unable to obtain a CPAP due to insurance changes. This will need to be arranged as an outpatient.   5. AKI/Hyponatremia - Baseline creatinine 0.7 - 0.9. At 1.29 on admission and improved to 0.98 today. Na+ was at 128 and improved to 132. Would stop Hydrochlorothiazide as discussed above.     Risk Assessment/Risk Scores:   CHA2DS2-VASc Score = 2   This indicates a 2.2% annual risk of stroke. The patient's score is based upon: CHF History: 0 HTN History: 1 Diabetes History: 1 Stroke History: 0 Vascular Disease History: 0 Age Score: 0 Gender Score: 0   For questions or updates, please contact [facility] Please consult [email or web address] for contact info under    Signed, Ellsworth Lennox, PA-C  04/16/2023 9:12 AM  Attending note Patient seen and discussed wth PA Strader, I agree with her documentation. 52 yo male history of DM2, HLD< HTN, OSA presented to ER with tachycardia. Found to be in aflutter with RVR, new diagnosis for patient. Admitted to medicine service on diltiazem drip, cardiology consulted to help manage  Na 128 K 3.8 BUN 32 Cr 1.29 WBC 8.7 Hgb 14.4 Plt 272 TSSH 0.858 BNP 10 Mg 2.3 Free T4 1.02 Trop 12-->12 EKG typical aflutter with RVR CXR no acute process  CT PE: no PE, cardiomegaly mild edema  1.Atrial flutter - new diagnosis this admission, presented with aflutter with RVR - on dilt gtt overnight, rates variable but overall difficult to control - CHADS2Vasc score is 2, started on hep gtt. Can concert to DOAC tomorrow - aflutter difficult to rate control, plans would be for TEE/DCCV today - flutter is typical, if recurrence would refer to EP to consider ablation.   Dina Rich MD

## 2023-04-16 NOTE — Progress Notes (Signed)
Electrical Cardioversion Procedure Note Jonathan Sellers [id number] 07-21-1971  Procedure: Electrical Cardioversion Indications:  Atrial Fibrillation  Procedure Details Consent: signed and on chart Time Out: Verified patient identification, verified procedure, site/side was marked, verified correct patient position, special equipment/implants available, medications/allergies/relevent history reviewed, required imaging and test results available.  1038  Patient placed on cardiac monitor, pulse oximetry, supplemental oxygen as necessary.  Sedation given: per anesthesia propofol Pacer pads placed anterior and posterior chest.  Cardioverted 1 time(s).  Cardioverted at 200J.  Evaluation Findings: Post procedure EKG shows: NSR Complications: None Patient did tolerate procedure well.   Jonathan Sellers 04/16/2023, 11:12 AM

## 2023-04-16 NOTE — Transfer of Care (Signed)
Immediate Anesthesia Transfer of Care Note  Patient: Jonathan Sellers  Procedure(s) Performed: CARDIOVERSION  Patient Location: PACU  Anesthesia Type:General  Level of Consciousness: drowsy  Airway & Oxygen Therapy: Patient Spontanous Breathing and Patient connected to face mask oxygen  Post-op Assessment: Report given to RN and Post -op Vital signs reviewed and stable  Post vital signs: Reviewed and stable  Last Vitals:  Vitals Value Taken Time  BP 105/76   Temp 97.6   Pulse 71   Resp 15   SpO2 98%     Last Pain:  Vitals:   04/16/23 1005  TempSrc: Oral  PainSc: 0-No pain         Complications: No notable events documented.

## 2023-04-16 NOTE — TOC Benefit Eligibility Note (Signed)
Patient Product/process development scientist completed.    The patient is insured through [facility]. Patient has [employer], may use a copay card, and/or apply for patient assistance if available.    Ran test claim for Eliquis 5 mg and the current 30 day co-pay is $80.00.   This test claim was processed through [facility]- copay amounts may vary at other pharmacies due to pharmacy/plan contracts, or as the patient moves through the different stages of their insurance plan.     Roland Earl, CPHT Pharmacy Technician III Certified Patient Advocate [facility] Patient Advocate Team Direct Number: [phone]  Fax: [phone]

## 2023-04-16 NOTE — Discharge Summary (Signed)
Physician Discharge Summary  Jonathan Sellers ZOX:[id number] DOB: 1970-12-30 DOA: 04/15/2023  PCP: Benetta Spar, MD  Admit date: 04/15/2023  Discharge date: 04/16/2023  Admitted From:Home  Disposition:  Home  Recommendations for Outpatient Follow-up:  Follow up with PCP in 1-2 weeks Repeat BMP in 1 week Follow-up with cardiology as scheduled 9/19 Remain on Eliquis 5 mg twice daily as prescribed and discontinue aspirin Continue torsemide daily Lisinopril 10 mg daily and discontinue hydrochlorothiazide Metoprolol 25 mg twice daily Continue other home medications as prior  Home Health: None  Equipment/Devices: None  Discharge Condition:Stable  CODE STATUS: Full  Diet recommendation: Heart Healthy/carb modified  Brief/Interim Summary: Jonathan Sellers is a 52 y.o. male with medical history significant of diabetes mellitus type 2, hyperlipidemia, hypertension, sleep apnea, and more presents the ED with a chief complaint of tachycardia.  Patient reports that he is in a normal state of health at a routine follow-up with his PCP when he was sent here for tachycardia.  Patient was noted to be in atrial flutter with RVR and was started on IV heparin and Cardizem drips.  He was seen by cardiology and underwent TEE/DCCV and converted to sinus rhythm.  He continues to remain in sinus rhythm with stable heart rate control and therefore appears to be stable for discharge from cardiology standpoint.  He will remain on Eliquis as prescribed and also be on metoprolol.  He will have close follow-up as noted above.  No other acute events or concerns noted.  Discharge Diagnoses:  Principal Problem:   Atrial flutter (HCC) Active Problems:   Essential hypertension, benign   Mixed hyperlipidemia   Morbid obesity (HCC)   Type 2 diabetes mellitus with hyperglycemia (HCC)   AKI (acute kidney injury) (HCC)  Principal discharge diagnosis: New onset atrial flutter with RVR along with AKI.  Discharge  Instructions  Discharge Instructions     Diet - low sodium heart healthy   Complete by: As directed    Increase activity slowly   Complete by: As directed       Allergies as of 04/16/2023   No Known Allergies      Medication List     STOP taking these medications    Aspirin Low Dose 81 MG tablet Generic drug: aspirin EC   lisinopril-hydrochlorothiazide 20-12.5 MG tablet Commonly known as: ZESTORETIC       TAKE these medications    apixaban 5 MG Tabs tablet Commonly known as: ELIQUIS Take 1 tablet (5 mg total) by mouth 2 (two) times daily.   atorvastatin 40 MG tablet Commonly known as: LIPITOR Take 40 mg by mouth daily.   Dexcom G7 Receiver Devi Use as instructed.   FreeStyle Libre 3 Reader [place] 1 each by Other route as directed. Use to check glucose as directed.   empagliflozin 10 MG Tabs tablet Commonly known as: Jardiance Take 1 tablet (10 mg total) by mouth daily before breakfast.   FreeStyle Libre 3 Sensor Misc Use to check glucose continuously as directed.   Lantus SoloStar 100 UNIT/ML Solostar Pen Generic drug: insulin glargine Inject 70 Units into the skin at bedtime.   lisinopril 10 MG tablet Commonly known as: ZESTRIL Take 1 tablet (10 mg total) by mouth daily. Start taking on: April 17, 2023   metFORMIN 500 MG tablet Commonly known as: GLUCOPHAGE Take 500 mg by mouth 2 (two) times daily.   metoprolol tartrate 25 MG tablet Commonly known as: LOPRESSOR Take 1 tablet (25 mg total) by mouth  2 (two) times daily.   Potassium 99 MG Tabs Take 1 tablet by mouth daily.   torsemide 20 MG tablet Commonly known as: DEMADEX Take 20 mg by mouth daily.        Follow-up Information     Ellsworth Lennox, PA-C Follow up on 05/09/2023.   Specialties: Cardiology, Cardiology Why: [facility]iology [facility] Follow-up on 05/09/2023 at 3:30 PM. Contact information: [address] [place] [address] [phone]         Benetta Spar,  MD. Schedule an appointment as soon as possible for a visit in 4 week(s).   Specialty: Internal Medicine Contact information: [address] [place] [address] [phone]                No Known Allergies  Consultations: Cardiology   Procedures/Studies: ECHO TEE  Result Date: 04/16/2023    TRANSESOPHOGEAL ECHO REPORT   Patient Name:   Jonathan Sellers Greeno Date of Exam: 04/16/2023 Medical Rec #:  [id number]    Height:       67.0 in Accession #:    [id number]   Weight:       282.0 lb Date of Birth:  1971-06-04    BSA:          2.340 m Patient Age:    51 years     BP:           105/94 mmHg Patient Gender: M            HR:           98 bpm. [facility] Procedure: Cardiac Doppler, Transesophageal Echo and Limited Color Doppler Indications:    Syncope R55  History:        Patient has prior history of Echocardiogram examinations, most                 recent 12/20/2022. Arrythmias:Atrial Flutter; Risk                 Factors:Hypertension, Dyslipidemia, Diabetes, Non-Smoker and                 Sleep Apnea.  Sonographer:    Aron Baba Referring Phys: [id number] Ellsworth Lennox  Sonographer Comments: Patient is obese. PROCEDURE: The transesophogeal probe was passed without difficulty through the esophogus of the patient. Imaged were obtained with the patient in a left lateral decubitus position. Local oropharyngeal anesthetic was provided with Cetacaine. Sedation performed by different physician. The patient's vital signs; including heart rate, blood pressure, and oxygen saturation; remained stable throughout the procedure. The patient developed no complications during the procedure. A successful direct current cardioversion was performed.  IMPRESSIONS  1. Difficult assessment of LVEF by TEE in setting of atrial flutter with elevated rates. Grossly LVEF 50%, would recommend TTE over time in sinus rhythm to clarify function. . Left ventricular ejection fraction, by estimation, is 50%.  The left ventricle  has low normal function.  2. Right ventricular systolic function is normal. The right ventricular size is normal.  3. Left atrial size was mildly dilated. No left atrial/left atrial appendage thrombus was detected. The LAA emptying velocity was 83 cm/s.  4. Right atrial size was mildly dilated.  5. The mitral valve is abnormal. Mild mitral valve regurgitation. No evidence of mitral stenosis.  6. The tricuspid valve is abnormal. Tricuspid valve regurgitation is moderate.  7. The aortic valve is tricuspid. Aortic valve regurgitation is not visualized. No aortic stenosis is present.  8. Agitated saline contrast bubble study was negative, with no evidence of any interatrial shunt. FINDINGS  Left Ventricle: Difficult assessment of LVEF by TEE in setting of atrial flutter with elevated rates. Grossly LVEF 50%, would recommend TTE over time in sinus rhythm to clarify function. Left ventricular ejection fraction, by estimation, is 50%. The left ventricle has low normal function. The left ventricular internal cavity size was normal in size. Right Ventricle: The right ventricular size is normal. Right vetricular wall thickness was not well visualized. Right ventricular systolic function is normal. Left Atrium: Left atrial size was mildly dilated. No left atrial/left atrial appendage thrombus was detected. The LAA emptying velocity was 83 cm/s. Right Atrium: Right atrial size was mildly dilated. Pericardium: There is no evidence of pericardial effusion. Mitral Valve: The mitral valve is abnormal. Mild mitral valve regurgitation. No evidence of mitral valve stenosis. Tricuspid Valve: The tricuspid valve is abnormal. Tricuspid valve regurgitation is moderate . No evidence of tricuspid stenosis. Aortic Valve: The aortic valve is tricuspid. Aortic valve regurgitation is not visualized. No aortic stenosis is present. Pulmonic Valve: The pulmonic valve was not well visualized. Pulmonic valve regurgitation is  mild. No evidence of pulmonic stenosis. Aorta: The aortic root is normal in size and structure. IAS/Shunts: The interatrial septum is aneurysmal. No atrial level shunt detected by color flow Doppler. Agitated saline contrast bubble study was negative, with no evidence of any interatrial shunt. Additional Comments: Spectral Doppler performed.  AORTA Ao Root diam: 3.60 cm Dina Rich MD Electronically signed by Dina Rich MD Signature Date/Time: 04/16/2023/1:13:30 PM    Final    CT Angio Chest PE W and/or Wo Contrast  Result Date: 04/15/2023 CLINICAL DATA:  Tachycardia EXAM: CT ANGIOGRAPHY CHEST WITH CONTRAST TECHNIQUE: Multidetector CT imaging of the chest was performed using the standard protocol during bolus administration of intravenous contrast. Multiplanar CT image reconstructions and MIPs were obtained to evaluate the vascular anatomy. RADIATION DOSE REDUCTION: This exam was performed according to the departmental dose-optimization program which includes automated exposure control, adjustment of the mA and/or kV according to patient size and/or use of iterative reconstruction technique. CONTRAST:  75mL OMNIPAQUE IOHEXOL 350 MG/ML SOLN COMPARISON:  None Available. FINDINGS: Cardiovascular: Satisfactory opacification of the pulmonary arteries to the segmental level. No evidence of pulmonary embolism. Mildly enlarged heart size. No pericardial effusion. Mediastinum/Nodes: No enlarged mediastinal, hilar, or axillary lymph nodes. Thyroid gland, trachea, and esophagus demonstrate no significant findings. Lungs/Pleura: No focal consolidation. Mild bilateral interstitial thickening. No pleural effusion or pneumothorax. Upper Abdomen: No acute abnormality. Musculoskeletal: No chest wall abnormality. No acute or significant osseous findings. Other: Retroareolar fibroglandular tissue as can be seen with gynecomastia. Review of the MIP images confirms the above findings. IMPRESSION: 1. No evidence of pulmonary  embolus. 2. Cardiomegaly with mild interstitial edema. Electronically Signed   By: Elige Ko M.D.   On: 04/15/2023 15:02   DG Chest Port 1 View  Result Date: 04/15/2023 CLINICAL DATA:  Atrial fibrillation. EXAM: PORTABLE CHEST 1 VIEW COMPARISON:  Dec 20, 2022. FINDINGS: Stable cardiomegaly.  Lungs are clear.  Bony thorax is unremarkable. IMPRESSION: No active disease. Electronically Signed   By: Lupita Raider M.D.   On: 04/15/2023 12:05     Discharge Exam: Vitals:   04/16/23 1200 04/16/23 1300  BP: 123/69 131/65  Pulse: 83 88  Resp: 13 (!) 23  Temp:    SpO2: 96% 96%   Vitals:   04/16/23 1130 04/16/23 1159 04/16/23 1200 04/16/23 1300  BP: 109/72 123/69 123/69 131/65  Pulse: 85 81 83 88  Resp: (!) 27 20 13  (!) 23  Temp:      TempSrc:      SpO2: 93% 97% 96% 96%  Weight:      Height:        General: Pt is alert, awake, not in acute distress Cardiovascular: RRR, S1/S2 +, no rubs, no gallops Respiratory: CTA bilaterally, no wheezing, no rhonchi Abdominal: Soft, NT, ND, bowel sounds + Extremities: no edema, no cyanosis    The results of significant diagnostics from this hospitalization (including imaging, microbiology, ancillary and laboratory) are listed below for reference.     Microbiology: Recent Results (from the past 240 hour(s))  MRSA Next Gen by PCR, Nasal     Status: None   Collection Time: 04/15/23  8:58 PM   Specimen: Nasal Mucosa; Nasal Swab  Result Value Ref Range Status   MRSA by PCR Next Gen NOT DETECTED NOT DETECTED Final    Comment: (NOTE) The GeneXpert MRSA Assay (FDA approved for NASAL specimens only), is one component of a comprehensive MRSA colonization surveillance program. It is not intended to diagnose MRSA infection nor to guide or monitor treatment for MRSA infections. Test performance is not FDA approved in patients less than 65 years old. Performed at [facility], [address] [place], [place] [address]      Labs: BNP (last 3  results) Recent Labs    12/19/22 1101 04/15/23 1125  BNP 27.0 10.0   Basic Metabolic Panel: Recent Labs  Lab 04/15/23 1125 04/16/23 0446  NA 128* 132*  K 3.8 4.1  CL 90* 96*  CO2 25 26  GLUCOSE 358* 243*  BUN 32* 26*  CREATININE 1.29* 0.98  CALCIUM 9.8 9.8  MG 2.3 2.5*   Liver Function Tests: Recent Labs  Lab 04/16/23 0446  AST 24  ALT 45*  ALKPHOS 92  BILITOT 0.8  PROT 6.2*  ALBUMIN 3.4*   No results for input(s): "LIPASE", "AMYLASE" in the last 168 hours. No results for input(s): "AMMONIA" in the last 168 hours. CBC: Recent Labs  Lab 04/15/23 1125 04/16/23 0446  WBC 8.7 7.7  NEUTROABS  --  5.7  HGB 14.4 14.0  HCT 42.1 41.6  MCV 92.9 95.6  PLT 272 267   Cardiac Enzymes: No results for input(s): "CKTOTAL", "CKMB", "CKMBINDEX", "TROPONINI" in the last 168 hours. BNP: Invalid input(s): "POCBNP" CBG: Recent Labs  Lab 04/15/23 1858 04/15/23 2107 04/16/23 0734 04/16/23 1146  GLUCAP 338* 355* 218* 197*   D-Dimer No results for input(s): "DDIMER" in the last 72 hours. Hgb A1c No results for input(s): "HGBA1C" in the last 72 hours. Lipid Profile No results for input(s): "CHOL", "HDL", "LDLCALC", "TRIG", "CHOLHDL", "LDLDIRECT" in the last 72 hours. Thyroid function studies Recent Labs    04/16/23 0446  TSH 0.234*   Anemia work up No results for input(s): "VITAMINB12", "FOLATE", "FERRITIN", "TIBC", "IRON", "RETICCTPCT" in the last 72 hours. Urinalysis    Component Value Date/Time   COLORURINE STRAW (A) 12/20/2022 0203   APPEARANCEUR CLEAR 12/20/2022 0203   LABSPEC 1.033 (H) 12/20/2022 0203   PHURINE 6.0 12/20/2022 0203   GLUCOSEU >=500 (A) 12/20/2022 0203   HGBUR NEGATIVE 12/20/2022 0203   BILIRUBINUR NEGATIVE 12/20/2022 0203   KETONESUR 20 (A) 12/20/2022 0203   PROTEINUR 100 (A) 12/20/2022 0203   UROBILINOGEN 0.2 03/11/2015 1703   NITRITE NEGATIVE 12/20/2022 0203   LEUKOCYTESUR NEGATIVE 12/20/2022 0203   Sepsis Labs Recent Labs  Lab  04/15/23 1125 04/16/23 0446  WBC 8.7 7.7   Microbiology Recent Results (from the past 240 hour(s))  MRSA Next Gen by PCR, Nasal     Status: None   Collection Time: 04/15/23  8:58 PM   Specimen: Nasal Mucosa; Nasal Swab  Result Value Ref Range Status   MRSA by PCR Next Gen NOT DETECTED NOT DETECTED Final    Comment: (NOTE) The GeneXpert MRSA Assay (FDA approved for NASAL specimens only), is one component of a comprehensive MRSA colonization surveillance program. It is not intended to diagnose MRSA infection nor to guide or monitor treatment for MRSA infections. Test performance is not FDA approved in patients less than 48 years old. Performed at [facility], [address] [place], [place] [address]      Time coordinating discharge: 35 minutes  SIGNED:   Erick Blinks, DO [facility] 04/16/2023, 2:25 PM  If 7PM-7AM, please contact night-coverage [email or web address]

## 2023-04-16 NOTE — Anesthesia Preprocedure Evaluation (Addendum)
Anesthesia Evaluation  Patient identified by MRN, date of birth, ID band Patient awake    Reviewed: Allergy & Precautions, NPO status , Patient's Chart, lab work & pertinent test results  Airway Mallampati: I  TM Distance: >3 FB Neck ROM: Full    Dental  (+) Teeth Intact, Dental Advisory Given   Pulmonary sleep apnea           Cardiovascular hypertension, Pt. on medications  Rhythm:Irregular Rate:Abnormal     Neuro/Psych    GI/Hepatic   Endo/Other  diabetes, Poorly Controlled, Type 2, Insulin Dependent    Renal/GU ARFRenal disease     Musculoskeletal   Abdominal  (+) + obese  Peds  Hematology   Anesthesia Other Findings   Reproductive/Obstetrics                             Anesthesia Physical Anesthesia Plan  ASA: 3  Anesthesia Plan: General   Post-op Pain Management:    Induction: Intravenous  PONV Risk Score and Plan: Propofol infusion  Airway Management Planned: Natural Airway, Simple Face Mask and Nasal Cannula  Additional Equipment:   Intra-op Plan:   Post-operative Plan:   Informed Consent:   Plan Discussed with: Anesthesiologist  Anesthesia Plan Comments:        Anesthesia Quick Evaluation

## 2023-04-17 ENCOUNTER — Encounter (HOSPITAL_COMMUNITY): Payer: Self-pay | Admitting: Cardiology

## 2023-04-17 NOTE — Anesthesia Postprocedure Evaluation (Signed)
Anesthesia Post Note  Patient: Jonathan Sellers  Procedure(s) Performed: CARDIOVERSION  Patient location during evaluation: Phase II Anesthesia Type: General Level of consciousness: awake Pain management: pain level controlled Vital Signs Assessment: post-procedure vital signs reviewed and stable Respiratory status: spontaneous breathing and respiratory function stable Cardiovascular status: blood pressure returned to baseline and stable Postop Assessment: no headache and no apparent nausea or vomiting Anesthetic complications: no Comments: Late entry   No notable events documented.   Last Vitals:  Vitals:   04/16/23 1200 04/16/23 1300  BP: 123/69 131/65  Pulse: 83 88  Resp: 13 (!) 23  Temp:    SpO2: 96% 96%    Last Pain:  Vitals:   04/16/23 1128  TempSrc:   PainSc: 0-No pain                 [name]

## 2023-05-09 ENCOUNTER — Encounter: Payer: Self-pay | Admitting: *Deleted

## 2023-05-09 ENCOUNTER — Encounter: Payer: Self-pay | Admitting: Student

## 2023-05-09 ENCOUNTER — Ambulatory Visit: Payer: No Typology Code available for payment source | Attending: Student | Admitting: Student

## 2023-05-09 VITALS — BP 132/62 | HR 102 | Ht 67.0 in | Wt 294.0 lb

## 2023-05-09 DIAGNOSIS — I10 Essential (primary) hypertension: Secondary | ICD-10-CM

## 2023-05-09 DIAGNOSIS — I5022 Chronic systolic (congestive) heart failure: Secondary | ICD-10-CM

## 2023-05-09 DIAGNOSIS — E782 Mixed hyperlipidemia: Secondary | ICD-10-CM | POA: Diagnosis not present

## 2023-05-09 DIAGNOSIS — G4730 Sleep apnea, unspecified: Secondary | ICD-10-CM

## 2023-05-09 DIAGNOSIS — I483 Typical atrial flutter: Secondary | ICD-10-CM | POA: Diagnosis not present

## 2023-05-09 DIAGNOSIS — E871 Hypo-osmolality and hyponatremia: Secondary | ICD-10-CM

## 2023-05-09 MED ORDER — METOPROLOL TARTRATE 50 MG PO TABS: 50.0000 mg | ORAL_TABLET | Freq: Two times a day (BID) | ORAL | 3 refills | Status: AC

## 2023-05-09 MED ORDER — APIXABAN 5 MG PO TABS: 5.0000 mg | ORAL_TABLET | Freq: Two times a day (BID) | ORAL | 3 refills | Status: AC

## 2023-05-09 MED ORDER — LISINOPRIL 10 MG PO TABS: 10.0000 mg | ORAL_TABLET | Freq: Every day | ORAL | 3 refills | Status: AC

## 2023-05-09 NOTE — Patient Instructions (Signed)
Medication Instructions:  Your physician has recommended you make the following change in your medication:   Take Lopressor 50 mg Two Times Daily   *If you need a refill on your cardiac medications before your next appointment, please call your pharmacy*   Lab Work: NONE   If you have labs (blood work) drawn today and your tests are completely normal, you will receive your results only by: MyChart Message (if you have MyChart) OR A paper copy in the mail If you have any lab test that is abnormal or we need to change your treatment, we will call you to review the results.   Testing/Procedures: Your physician has requested that you have an echocardiogram. Echocardiography is a painless test that uses sound waves to create images of your heart. It provides your doctor with information about the size and shape of your heart and how well your heart's chambers and valves are working. This procedure takes approximately one hour. There are no restrictions for this procedure. Please do NOT wear cologne, perfume, aftershave, or lotions (deodorant is allowed). Please arrive 15 minutes prior to your appointment time.    Follow-Up: At [facility], you and your health needs are our priority.  As part of our continuing mission to provide you with exceptional heart care, we have created designated Provider Care Teams.  These Care Teams include your primary Cardiologist (physician) and Advanced Practice Providers (APPs -  Physician Assistants and Nurse Practitioners) who all work together to provide you with the care you need, when you need it.  We recommend signing up for the patient portal called "MyChart".  Sign up information is provided on this After Visit Summary.  MyChart is used to connect with patients for Virtual Visits (Telemedicine).  Patients are able to view lab/test results, encounter notes, upcoming appointments, etc.  Non-urgent messages can be sent to your provider as well.    To learn more about what you can do with MyChart, go to [email or web address].    Your next appointment:   3 month(s)  Provider:   Dina Rich, MD or Randall An, PA-C    Other Instructions Thank you for choosing [facility]!

## 2023-05-09 NOTE — Progress Notes (Signed)
Cardiology Office Note    Date:  05/09/2023  ID:  Jonathan Sellers, DOB [birth year], MRN [id number] Cardiologist: Dina Rich, MD    History of Present Illness:    Jonathan Sellers is a 52 y.o. male with past medical history of HTN, HLD, Type II DM, OSA and newly diagnosed atrial flutter with RVR (diagnosed in 03/2023) who presents to the office today for hospital follow-up.  He was most recently admitted to [facility] last month for evaluation of worsening shortness of breath over the past 1 to 2 weeks and after having been found tachycardic by his PCP. He was found to be in atrial flutter with RVR and was started on IV Cardizem along with IV Heparin. He did undergo TEE/DCCV and converted back to normal sinus rhythm with a single 200 J shock. He was maintaining normal sinus rhythm after his procedure and was discharged on Lopressor 25 mg twice daily and Eliquis 5 mg twice daily.  He had been on Lisinopril-HCTZ prior to admission and it was recommend to start Lisinopril alone at 10 mg daily given his AKI and hyponatremia on admission. It was recommended to obtain a follow-up outpatient transthoracic echocardiogram if he maintained normal sinus rhythm for reassessment of his EF as this was estimated at 50% by TEE but he did have limited views.  In talking with the patient today, he reports feeling well since his hospitalization. He denies any recent chest pain or palpitations. Reports that his breathing and energy level have significantly improved. No recent orthopnea, PND or pitting edema. He reports good urination with his current dose of Torsemide. His heart rate was initially elevated in the 110's but did improve on recheck as he reported going to the wrong place at the time of his office visit today and had to walk around the hospital. Says HR has been well-controlled at home. He remains on Eliquis for anticoagulation and denies any recent melena, hematochezia or hematuria.  Studies Reviewed:   EKG:  EKG is ordered today and demonstrates:   EKG Interpretation Date/Time:  Thursday May 09 2023 15:54:48 EDT Ventricular Rate:  116 PR Interval:  132 QRS Duration:  84 QT Interval:  324 QTC Calculation: 450 R Axis:   34  Text Interpretation: Sinus tachycardia When compared with ECG of 16-Apr-2023 11:06, Confirmed by Randall An ([id number]) on 05/09/2023 7:40:56 PM       TEE: 03/2023 IMPRESSIONS     1. Difficult assessment of LVEF by TEE in setting of atrial flutter with  elevated rates. Grossly LVEF 50%, would recommend TTE over time in sinus  rhythm to clarify function. . Left ventricular ejection fraction, by  estimation, is 50%. The left ventricle   has low normal function.   2. Right ventricular systolic function is normal. The right ventricular  size is normal.   3. Left atrial size was mildly dilated. No left atrial/left atrial  appendage thrombus was detected. The LAA emptying velocity was 83 cm/s.   4. Right atrial size was mildly dilated.   5. The mitral valve is abnormal. Mild mitral valve regurgitation. No  evidence of mitral stenosis.   6. The tricuspid valve is abnormal. Tricuspid valve regurgitation is  moderate.   7. The aortic valve is tricuspid. Aortic valve regurgitation is not  visualized. No aortic stenosis is present.   8. Agitated saline contrast bubble study was negative, with no evidence  of any interatrial shunt.   Risk Assessment/Calculations:    CHA2DS2-VASc Score =  2   This indicates a 2.2% annual risk of stroke. The patient's score is based upon: CHF History: 0 HTN History: 1 Diabetes History: 1 Stroke History: 0 Vascular Disease History: 0 Age Score: 0 Gender Score: 0    Physical Exam:   VS:  BP 132/62   Pulse (!) 102   Ht 5\' 7"  (1.702 m)   Wt 294 lb (133.4 kg)   SpO2 95%   BMI 46.05 kg/m    Wt Readings from Last 3 Encounters:  05/09/23 294 lb (133.4 kg)  04/16/23 281 lb 15.5 oz (127.9 kg)  04/04/23 297 lb 12.8 oz  (135.1 kg)     GEN: Pleasant male appearing in no acute distress NECK: No JVD; No carotid bruits CARDIAC: RRR, no murmurs, rubs, gallops RESPIRATORY:  Clear to auscultation without rales, wheezing or rhonchi  ABDOMEN: Appears non-distended. No obvious abdominal masses. EXTREMITIES: No clubbing or cyanosis. Trace lower extremity edema.  Distal pedal pulses are 2+ bilaterally.   Assessment and Plan:   1. Atrial Flutter with RVR - He did undergo successful TEE/DCCV during admission and is maintaining sinus rhythm at this time. He did have sinus tachycardia today after walking around the hospital and this improved on recheck. Will plan to titrate Lopressor from 25 mg twice daily to 50 mg twice daily. He was provided with a HR/BP log to return in several weeks. If he has recurrent atrial flutter, it was previously recommended to refer to EP for consideration of ablation. - No reports of active bleeding. Continue Eliquis 5 mg twice daily for anticoagulation. Will request a copy of most recent records from his PCP.  2. Chronic HFmrEF - His EF was read as 50% by recent TEE and likely due to atrial flutter with RVR at that time. Will plan to obtain a follow-up limited echocardiogram for reassessment of his EF as recommended during his hospitalization. Continue Torsemide 20 mg daily and we will request a copy of most recent labs from his PCP as he reports these were checked approximately 2 weeks ago. Continue Lopressor but will titrate to 50 mg twice daily and continue Lisinopril 10 mg daily.  3. HTN - BP is well-controlled at 132/62 during today's visit. Continue Lisinopril 10 mg daily.  Will titrate Lopressor from 25 mg twice daily to 50 mg twice daily as discussed above.  4. HLD - Followed by his PCP. He remains on Atorvastatin 40 mg daily.  5. Hyponatremia - Na+ was at 132 at the time of hospital discharge and he does report having follow-up labs with his PCP in the interim. We will request a  copy of these.  6. OSA - Reports he was diagnosed with sleep apnea in the past but was unable to obtain a CPAP given insurance difficulty at that time. Will refer to [facility] for further management of his sleep apnea as this is a likely trigger for his atrial flutter with RVR.  Signed, Ellsworth Lennox, PA-C

## 2023-05-16 ENCOUNTER — Other Ambulatory Visit: Payer: Self-pay

## 2023-05-16 ENCOUNTER — Ambulatory Visit: Payer: No Typology Code available for payment source | Admitting: "Endocrinology

## 2023-05-16 ENCOUNTER — Encounter (HOSPITAL_COMMUNITY): Payer: Self-pay | Admitting: Emergency Medicine

## 2023-05-16 ENCOUNTER — Emergency Department (HOSPITAL_COMMUNITY)
Admission: EM | Admit: 2023-05-16 | Discharge: 2023-05-17 | Disposition: A | Discharge status: Home / Self Care | Payer: No Typology Code available for payment source | Attending: Emergency Medicine | Admitting: Emergency Medicine

## 2023-05-16 ENCOUNTER — Encounter: Payer: Self-pay | Admitting: "Endocrinology

## 2023-05-16 VITALS — BP 118/82 | HR 88 | Ht 67.0 in | Wt 294.6 lb

## 2023-05-16 DIAGNOSIS — Z7901 Long term (current) use of anticoagulants: Secondary | ICD-10-CM | POA: Insufficient documentation

## 2023-05-16 DIAGNOSIS — R739 Hyperglycemia, unspecified: Secondary | ICD-10-CM | POA: Diagnosis present

## 2023-05-16 DIAGNOSIS — Z794 Long term (current) use of insulin: Secondary | ICD-10-CM | POA: Insufficient documentation

## 2023-05-16 DIAGNOSIS — I10 Essential (primary) hypertension: Secondary | ICD-10-CM | POA: Diagnosis not present

## 2023-05-16 DIAGNOSIS — E782 Mixed hyperlipidemia: Secondary | ICD-10-CM

## 2023-05-16 DIAGNOSIS — Z7984 Long term (current) use of oral hypoglycemic drugs: Secondary | ICD-10-CM | POA: Insufficient documentation

## 2023-05-16 DIAGNOSIS — Z79899 Other long term (current) drug therapy: Secondary | ICD-10-CM | POA: Diagnosis not present

## 2023-05-16 DIAGNOSIS — E1165 Type 2 diabetes mellitus with hyperglycemia: Secondary | ICD-10-CM | POA: Insufficient documentation

## 2023-05-16 DIAGNOSIS — E1169 Type 2 diabetes mellitus with other specified complication: Secondary | ICD-10-CM

## 2023-05-16 LAB — CBC
HCT: 40.3 % (ref 39.0–52.0)
Hemoglobin: 13.8 g/dL (ref 13.0–17.0)
MCH: 32.8 pg (ref 26.0–34.0)
MCHC: 34.2 g/dL (ref 30.0–36.0)
MCV: 95.7 fL (ref 80.0–100.0)
Platelets: 243 10*3/uL (ref 150–400)
RBC: 4.21 MIL/uL — ABNORMAL LOW (ref 4.22–5.81)
RDW: 14.6 % (ref 11.5–15.5)
WBC: 7.9 10*3/uL (ref 4.0–10.5)
nRBC: 0 % (ref 0.0–0.2)

## 2023-05-16 LAB — CBG MONITORING, ED: Glucose-Capillary: 284 mg/dL — ABNORMAL HIGH (ref 70–99)

## 2023-05-16 LAB — POCT GLYCOSYLATED HEMOGLOBIN (HGB A1C): HbA1c, POC (controlled diabetic range): 15 % — AB (ref 0.0–7.0)

## 2023-05-16 MED ORDER — EMPAGLIFLOZIN 25 MG PO TABS: 25.0000 mg | ORAL_TABLET | Freq: Every day | ORAL | 1 refills | Status: AC

## 2023-05-16 NOTE — Progress Notes (Signed)
05/16/2023, 3:34 PM  Endocrinology follow-up note   Subjective:    Patient ID: Jonathan Sellers, male    DOB: 03-11-1971.  Jonathan Sellers is being seen in follow-up after he was seen in consultation for management of currently uncontrolled symptomatic diabetes requested by  Benetta Spar, MD.   Past Medical History:  Diagnosis Date   Diabetes (HCC)    HLD (hyperlipidemia)    HTN (hypertension)    Sleep apnea     Past Surgical History:  Procedure Laterality Date   CARDIOVERSION N/A 04/16/2023   Procedure: CARDIOVERSION;  Surgeon: Antoine Poche, MD;  Location: [facility];  Service: Endoscopy;  Laterality: N/A;   COLONOSCOPY WITH PROPOFOL N/A 07/12/2020   Procedure: COLONOSCOPY WITH PROPOFOL;  Surgeon: Lanelle Bal, DO;  Location: [facility];  Service: Endoscopy;  Laterality: N/A;  8:00am   POLYPECTOMY  07/12/2020   Procedure: POLYPECTOMY;  Surgeon: Lanelle Bal, DO;  Location: [facility];  Service: Endoscopy;;   WISDOM TOOTH EXTRACTION      Social History   Socioeconomic History   Marital status: Married    Spouse name: Not on file   Number of children: Not on file   Years of education: Not on file   Highest education level: Not on file  Occupational History   Not on file  Tobacco Use   Smoking status: Never   Smokeless tobacco: Never  Vaping Use   Vaping status: Never Used  Substance and Sexual Activity   Alcohol use: No   Drug use: No   Sexual activity: Not on file  Other Topics Concern   Not on file  Social History Narrative   Not on file   Social Determinants of Health   Financial Resource Strain: Not on file  Food Insecurity: No Food Insecurity (04/15/2023)   Hunger Vital Sign    Worried About Running Out of Food in the Last Year: Never true    Ran Out of Food in the Last Year: Never true  Transportation Needs: No Transportation Needs (04/15/2023)    PRAPARE - Administrator, Civil Service (Medical): No    Lack of Transportation (Non-Medical): No  Physical Activity: Not on file  Stress: Not on file  Social Connections: Not on file    Family History  Problem Relation Age of Onset   Heart failure Mother    COPD Sister    Colon cancer Neg Hx     Outpatient Encounter Medications as of 05/16/2023  Medication Sig   apixaban (ELIQUIS) 5 MG TABS tablet Take 1 tablet (5 mg total) by mouth 2 (two) times daily.   atorvastatin (LIPITOR) 40 MG tablet Take 40 mg by mouth daily.   Continuous Glucose Receiver (DEXCOM G7 RECEIVER) DEVI Use as instructed.   Continuous Glucose Receiver (FREESTYLE LIBRE 3 READER) DEVI 1 each by Other route as directed. Use to check glucose as directed.   Continuous Glucose Sensor (FREESTYLE LIBRE 3 SENSOR) MISC Use to check glucose continuously as directed.   empagliflozin (JARDIANCE) 25 MG TABS tablet Take 1 tablet (25 mg total) by mouth  daily before breakfast.   LANTUS SOLOSTAR 100 UNIT/ML Solostar Pen Inject 80 Units into the skin at bedtime.   lisinopril (ZESTRIL) 10 MG tablet Take 1 tablet (10 mg total) by mouth daily.   metFORMIN (GLUCOPHAGE) 500 MG tablet Take 500 mg by mouth 2 (two) times daily.   metoprolol tartrate (LOPRESSOR) 50 MG tablet Take 1 tablet (50 mg total) by mouth 2 (two) times daily.   Potassium 99 MG TABS Take 1 tablet by mouth daily.   torsemide (DEMADEX) 20 MG tablet Take 20 mg by mouth daily.   [DISCONTINUED] empagliflozin (JARDIANCE) 10 MG TABS tablet Take 1 tablet (10 mg total) by mouth daily before breakfast.   No facility-administered encounter medications on file as of 05/16/2023.    ALLERGIES: No Known Allergies  VACCINATION STATUS: Immunization History  Administered Date(s) Administered   Moderna Sars-Covid-2 Vaccination 10/31/2019, 12/02/2019    Diabetes He presents for his follow-up diabetic visit. He has type 2 diabetes mellitus. His disease course has been  worsening. There are no hypoglycemic associated symptoms. Pertinent negatives for hypoglycemia include no confusion, headaches, pallor or seizures. Associated symptoms include blurred vision, polydipsia and polyuria. Pertinent negatives for diabetes include no chest pain, no fatigue, no polyphagia and no weakness. There are no hypoglycemic complications. Symptoms are worsening. (Morbid obesity, comorbid hyperlipidemia and hypertension, nonadherence to monitoring and follow-up.) Risk factors for coronary artery disease include dyslipidemia, diabetes mellitus, hypertension, male sex, obesity and sedentary lifestyle. His weight is increasing steadily. He is following a generally unhealthy diet. When asked about meal planning, he reported none. He has not had a previous visit with a dietitian. He never participates in exercise. His home blood glucose trend is increasing steadily. (He returns without any recent glucose monitoring logs nor meter.  He could not afford  a CGM prescription.  His point-of-care A1c is >15%.  During a recent ER trip, his blood glucose was 358.  ) An ACE inhibitor/angiotensin II receptor blocker is being taken. Eye exam is not current.  Hypertension This is a chronic problem. The current episode started more than 1 year ago. The problem has been rapidly worsening since onset. The problem is uncontrolled. Associated symptoms include blurred vision. Pertinent negatives include no chest pain, headaches, neck pain, palpitations or shortness of breath. Risk factors for coronary artery disease include dyslipidemia, diabetes mellitus, obesity and male gender. Past treatments include ACE inhibitors.  Hyperlipidemia This is a chronic problem. The current episode started more than 1 year ago. Exacerbating diseases include diabetes and obesity. Pertinent negatives include no chest pain, myalgias or shortness of breath. Current antihyperlipidemic treatment includes statins. Risk factors for coronary  artery disease include dyslipidemia, diabetes mellitus, hypertension, male sex and a sedentary lifestyle.     Review of Systems  Constitutional:  Negative for chills, fatigue, fever and unexpected weight change.  HENT:  Negative for dental problem, mouth sores and trouble swallowing.   Eyes:  Positive for blurred vision. Negative for visual disturbance.  Respiratory:  Negative for cough, choking, chest tightness, shortness of breath and wheezing.   Cardiovascular:  Negative for chest pain, palpitations and leg swelling.  Gastrointestinal:  Negative for abdominal distention, abdominal pain, constipation, diarrhea, nausea and vomiting.  Endocrine: Positive for polydipsia and polyuria. Negative for polyphagia.  Genitourinary:  Negative for dysuria, flank pain, hematuria and urgency.  Musculoskeletal:  Negative for back pain, gait problem, myalgias and neck pain.  Skin:  Negative for pallor, rash and wound.  Neurological:  Negative for seizures, syncope,  weakness, numbness and headaches.  Psychiatric/Behavioral:  Negative for confusion and dysphoric mood.     Objective:       05/16/2023    9:44 AM 05/09/2023    4:11 PM 05/09/2023    3:51 PM  Vitals with BMI  Height 5\' 7"   5\' 7"   Weight 294 lbs 10 oz  294 lbs  BMI 46.13  46.04  Systolic 118  132  Diastolic 82  62  Pulse 88 102 841    BP 118/82   Pulse 88   Ht 5\' 7"  (1.702 m)   Wt 294 lb 9.6 oz (133.6 kg)   BMI 46.14 kg/m   Wt Readings from Last 3 Encounters:  05/16/23 294 lb 9.6 oz (133.6 kg)  05/09/23 294 lb (133.4 kg)  04/16/23 281 lb 15.5 oz (127.9 kg)      CMP ( most recent) CMP     Component Value Date/Time   NA 132 (L) 04/16/2023 0446   K 4.1 04/16/2023 0446   CL 96 (L) 04/16/2023 0446   CO2 26 04/16/2023 0446   GLUCOSE 243 (H) 04/16/2023 0446   BUN 26 (H) 04/16/2023 0446   CREATININE 0.98 04/16/2023 0446   CALCIUM 9.8 04/16/2023 0446   PROT 6.2 (L) 04/16/2023 0446   ALBUMIN 3.4 (L) 04/16/2023 0446   AST 24  04/16/2023 0446   ALT 45 (H) 04/16/2023 0446   ALKPHOS 92 04/16/2023 0446   BILITOT 0.8 04/16/2023 0446   GFRNONAA >60 04/16/2023 0446   GFRAA >60 03/11/2015 1520     Diabetic Labs (most recent): Lab Results  Component Value Date   HGBA1C 15.0 (A) 05/16/2023   HGBA1C 14.3 (H) 12/20/2022   HGBA1C 14.0 (A) 12/19/2022     Assessment & Plan:   1. Type 2 diabetes mellitus with other specified complication, with long-term current use of insulin (HCC)  - Jonathan Sellers has currently uncontrolled symptomatic type 2 DM since  53 years of age. He returns without any recent glucose monitoring logs nor meter.  He could not afford  a CGM prescription.  His point-of-care A1c is >15%.  During a recent ER trip, his blood glucose was 358.  Recent labs reviewed.  He returns with his logbook showing significantly improved glycemic profile.  He was recently treated for lower extremity cellulitis and severe hypertension as an inpatient.  - I had a long discussion with him about the possible risk factors and  the pathology behind its diabetes and its complications.   -his diabetes is complicated by nonengagement, obesity/sedentary life, comorbid hypertension hyperlipidemia, large edematous lower extremity/lymphedema, and he remains at exceedingly  high risk for more acute and chronic complications which include CAD, CVA, CKD, retinopathy, and neuropathy.  These are all discussed in detail with him.    - I discussed all available options of managing his diabetes including de-escalation of medications. I have counseled him on Food as Medicine by adopting a Whole Food , Plant Predominant  ( WFPP) nutrition as recommended by [employer] of Lifestyle Medicine. Patient is encouraged to switch to  unprocessed or minimally processed  complex starch, adequate protein intake (mainly plant source), minimal liquid fat, plenty of fruits, and vegetables. -  he is advised to stick to a routine mealtimes to eat 3  complete meals a day and snack only when necessary ( to snack only to correct hypoglycemia BG <70 day time or <100 at night).  - he acknowledges that there is a room for improvement in  his food and drink choices. - Suggestion is made for him to avoid simple carbohydrates  from his diet including Cakes, Sweet Desserts, Ice Cream, Soda (diet and regular), Sweet Tea, Candies, Chips, Cookies, Store Bought Juices, Alcohol , Artificial Sweeteners,  Coffee Creamer, and "Sugar-free" Products, Lemonade. This will help patient to have more stable blood glucose profile and potentially avoid unintended weight gain.  The following Lifestyle Medicine recommendations according to American College of Lifestyle Medicine  [facility]) were discussed and and offered to patient and he  agrees to start the journey:  A. Whole Foods, Plant-Based Nutrition comprising of fruits and vegetables, plant-based proteins, whole-grain carbohydrates was discussed in detail with the patient.   A list for source of those nutrients were also provided to the patient.  Patient will use only water or unsweetened tea for hydration. B.  The need to stay away from risky substances including alcohol, smoking; obtaining 7 to 9 hours of restorative sleep, at least 150 minutes of moderate intensity exercise weekly, the importance of healthy social connections,  and stress management techniques were discussed. C.  A full color page of  Calorie density of various food groups per pound showing examples of each food groups was provided to the patient.   - he has been scheduled with Norm Salt, RDN, CDE for individualized diabetes education.  - I have approached him with the following individualized plan to manage  his diabetes and patient agrees:   -He has disengaged from self-care.  He does not monitor blood glucose regularly.  His point-of-care A1c is greater than 15% at risk for acute and, chronic complications of hyperglycemia.   -He may need  prandial insulin after he engages reasonably to follow recommendations.   -In preparation, he is approached to start monitoring blood glucose 4 times a day-before meals and at bedtime and return in 10 days with his meter and logs for reevaluation.    -In the meantime, he is advised to increase his Lantus to 80 units nightly, advised to increase his Jardiance to 25 mg p.o. daily at breakfast.   -He is encouraged to call clinic for hypoglycemia under 70 or hyperglycemia greater than 300 mg per DL.   - Specific targets for  A1c;  LDL, HDL,  and Triglycerides were discussed with the patient.  2) Blood Pressure /Hypertension:   -His blood pressure is controlled today.  He is advised to continue his Zestoretic 20-12.5 mg p.o. twice daily, Demadex 20 mg p.o. daily,   3) Lipids/Hyperlipidemia: No recent lipid panel to review.  He is advised to continue Lipitor 20 mg p.o. nightly.    4)  Weight/Diet:  Body mass index is 46.14 kg/m.  -   clearly complicating his diabetes care.   he is  a candidate for weight loss. I discussed with him the fact that loss of 5 - 10% of his  current body weight will have the most impact on his diabetes management.  The above detailed  ACLM recommendations for nutrition, exercise, sleep, social life, avoidance of risky substances, the need for restorative sleep   information will also detailed on discharge instructions.  5) Chronic Care/Health Maintenance:  -he  is on ACEI/ARB and Statin medications and  is encouraged to initiate and continue to follow up with Ophthalmology, Dentist,  Podiatrist at least yearly or according to recommendations, and advised to   stay away from smoking. I have recommended yearly flu vaccine and pneumonia vaccine at least every 5 years; moderate intensity  exercise for up to 150 minutes weekly; and  sleep for 7- 9 hours a day.  - he is  advised to maintain close follow up with Benetta Spar, MD for primary care needs, as well as his  other providers for optimal and coordinated care.  I spent  40  minutes in the care of the patient today including review of labs from CMP, Lipids, Thyroid Function, Hematology (current and previous including abstractions from other facilities); face-to-face time discussing  his blood glucose readings/logs, discussing hypoglycemia and hyperglycemia episodes and symptoms, medications doses, his options of short and long term treatment based on the latest standards of care / guidelines;  discussion about incorporating lifestyle medicine;  and documenting the encounter. Risk reduction counseling performed per USPSTF guidelines to reduce  obesity and cardiovascular risk factors.     Please refer to Patient Instructions for Blood Glucose Monitoring and Insulin/Medications Dosing Guide"  in media tab for additional information. Please  also refer to " Patient Self Inventory" in the Media  tab for reviewed elements of pertinent patient history.  Meyer Cory participated in the discussions, expressed understanding, and voiced agreement with the above plans.  All questions were answered to his satisfaction. he is encouraged to contact clinic should he have any questions or concerns prior to his return visit.    Follow up plan: - Return in about 8 days (around 05/24/2023) for F/U with Meter/CGM /Logs Only - no Labs.  Marquis Lunch, MD [facility] [address] [place], [place] [address] Phone: [phone]  Fax: [phone]    05/16/2023, 3:34 PM  This note was partially dictated with voice recognition software. Similar sounding words can be transcribed inadequately or may not  be corrected upon review.

## 2023-05-16 NOTE — Patient Instructions (Signed)

## 2023-05-16 NOTE — ED Triage Notes (Signed)
Pt reports he is out of insulin and unable to get more until tomorrow afternoon, reports CBG at home was 440

## 2023-05-17 LAB — COMPREHENSIVE METABOLIC PANEL
ALT: 42 U/L (ref 0–44)
AST: 29 U/L (ref 15–41)
Albumin: 3.5 g/dL (ref 3.5–5.0)
Alkaline Phosphatase: 80 U/L (ref 38–126)
Anion gap: 8 (ref 5–15)
BUN: 36 mg/dL — ABNORMAL HIGH (ref 6–20)
CO2: 27 mmol/L (ref 22–32)
Calcium: 9.5 mg/dL (ref 8.9–10.3)
Chloride: 96 mmol/L — ABNORMAL LOW (ref 98–111)
Creatinine, Ser: 1.26 mg/dL — ABNORMAL HIGH (ref 0.61–1.24)
GFR, Estimated: >60 mL/min (ref >60)
Glucose, Bld: 289 mg/dL — ABNORMAL HIGH (ref 70–99)
Potassium: 4.4 mmol/L (ref 3.5–5.1)
Sodium: 131 mmol/L — ABNORMAL LOW (ref 135–145)
Total Bilirubin: 0.6 mg/dL (ref 0.3–1.2)
Total Protein: 6.4 g/dL — ABNORMAL LOW (ref 6.5–8.1)

## 2023-05-17 LAB — URINALYSIS, ROUTINE W REFLEX MICROSCOPIC
Bacteria, UA: NONE SEEN
Bilirubin Urine: NEGATIVE
Glucose, UA: >=500 mg/dL — AB
Hgb urine dipstick: NEGATIVE
Ketones, ur: NEGATIVE mg/dL
Leukocytes,Ua: NEGATIVE
Nitrite: NEGATIVE
Protein, ur: NEGATIVE mg/dL
Specific Gravity, Urine: 1.018 (ref 1.005–1.030)
pH: 6 (ref 5.0–8.0)

## 2023-05-17 LAB — CBG MONITORING, ED: Glucose-Capillary: 274 mg/dL — ABNORMAL HIGH (ref 70–99)

## 2023-05-17 NOTE — Discharge Instructions (Signed)
Fill your prescription for insulin tomorrow and resume taking as prescribed.

## 2023-05-17 NOTE — ED Provider Notes (Signed)
[place] EMERGENCY DEPARTMENT AT [facility] Provider Note   CSN: [id number] Arrival date & time: 05/16/23  2206     History  Chief Complaint  Patient presents with   Hyperglycemia    Jonathan Sellers is a 52 y.o. male.  Patient is a 52 year old male with past medical history of hypertension, type 2 diabetes on insulin, atrial flutter.  Patient presenting today for elevated blood sugar.  He reports a reading of 440 at home this evening and tells me he ran out of his insulin yesterday and was unable to fill it today.  He denies any nausea or vomiting.  He denies any other complaints.  The history is provided by the patient.       Home Medications Prior to Admission medications   Medication Sig Start Date End Date Taking? Authorizing Provider  apixaban (ELIQUIS) 5 MG TABS tablet Take 1 tablet (5 mg total) by mouth 2 (two) times daily. 05/09/23   Strader, Lennart Pall, PA-C  atorvastatin (LIPITOR) 40 MG tablet Take 40 mg by mouth daily. 01/14/23   [provider]  Continuous Glucose Receiver (DEXCOM G7 RECEIVER) DEVI Use as instructed. 12/22/22   Sherryll Burger, Pratik D, DO  Continuous Glucose Receiver (FREESTYLE LIBRE 3 READER) DEVI 1 each by Other route as directed. Use to check glucose as directed. 02/05/23   Roma Kayser, MD  Continuous Glucose Sensor (FREESTYLE LIBRE 3 SENSOR) MISC Use to check glucose continuously as directed. 02/05/23   Roma Kayser, MD  empagliflozin (JARDIANCE) 25 MG TABS tablet Take 1 tablet (25 mg total) by mouth daily before breakfast. 05/16/23   Nida, Denman George, MD  LANTUS SOLOSTAR 100 UNIT/ML Solostar Pen Inject 80 Units into the skin at bedtime. 11/07/22   [provider]  lisinopril (ZESTRIL) 10 MG tablet Take 1 tablet (10 mg total) by mouth daily. 05/09/23   Strader, Lennart Pall, PA-C  metFORMIN (GLUCOPHAGE) 500 MG tablet Take 500 mg by mouth 2 (two) times daily. 02/04/23   [provider]  metoprolol tartrate  (LOPRESSOR) 50 MG tablet Take 1 tablet (50 mg total) by mouth 2 (two) times daily. 05/09/23   Strader, Lennart Pall, PA-C  Potassium 99 MG TABS Take 1 tablet by mouth daily.    [provider]  torsemide (DEMADEX) 20 MG tablet Take 20 mg by mouth daily. 07/08/22   [provider]      Allergies    Patient has no known allergies.    Review of Systems   Review of Systems  All other systems reviewed and are negative.   Physical Exam Updated Vital Signs BP 119/78   Pulse 78   Temp 97.6 F (36.4 C) (Oral)   Resp 18   Ht 5\' 7"  (1.702 m)   Wt 133.4 kg   SpO2 98%   BMI 46.05 kg/m  Physical Exam Vitals and nursing note reviewed.  Constitutional:      General: He is not in acute distress.    Appearance: He is well-developed. He is not diaphoretic.  HENT:     Head: Normocephalic and atraumatic.  Cardiovascular:     Rate and Rhythm: Normal rate and regular rhythm.     Heart sounds: No murmur heard.    No friction rub.  Pulmonary:     Effort: Pulmonary effort is normal. No respiratory distress.     Breath sounds: Normal breath sounds. No wheezing or rales.  Abdominal:     General: Bowel sounds are normal.  There is no distension.     Palpations: Abdomen is soft.     Tenderness: There is no abdominal tenderness.  Musculoskeletal:        General: Normal range of motion.     Cervical back: Normal range of motion and neck supple.  Skin:    General: Skin is warm and dry.  Neurological:     Mental Status: He is alert and oriented to person, place, and time.     Coordination: Coordination normal.     ED Results / Procedures / Treatments   Labs (all labs ordered are listed, but only abnormal results are displayed) Labs Reviewed  CBC - Abnormal; Notable for the following components:      Result Value   RBC 4.21 (*)    All other components within normal limits  URINALYSIS, ROUTINE W REFLEX MICROSCOPIC - Abnormal; Notable for the following components:   Glucose,  UA >=500 (*)    All other components within normal limits  COMPREHENSIVE METABOLIC PANEL - Abnormal; Notable for the following components:   Sodium 131 (*)    Chloride 96 (*)    Glucose, Bld 289 (*)    BUN 36 (*)    Creatinine, Ser 1.26 (*)    Total Protein 6.4 (*)    All other components within normal limits  CBG MONITORING, ED - Abnormal; Notable for the following components:   Glucose-Capillary 284 (*)    All other components within normal limits  CBG MONITORING, ED  CBG MONITORING, ED    EKG None  Radiology No results found.  Procedures Procedures    Medications Ordered in ED Medications - No data to display  ED Course/ Medical Decision Making/ A&P  Patient is a 52 year old male with history of type 2 diabetes on insulin presenting with elevated blood sugars after running out of his insulin.  Patient arrives here clinically well-appearing with stable vital signs.  Physical examination unremarkable.  CBC and metabolic panel obtained.  His initial blood sugar was 289, then repeated several hours later and is 270.  Electrolyte panel inconsistent with DKA.  I feel as though patient is stable for discharge.  He can fill his insulin tomorrow and follow-up with primary doctor as needed.  Final Clinical Impression(s) / ED Diagnoses Final diagnoses:  None    Rx / DC Orders ED Discharge Orders     None         Geoffery Lyons, MD 05/17/23 [phone]

## 2023-05-24 ENCOUNTER — Ambulatory Visit: Payer: No Typology Code available for payment source | Admitting: "Endocrinology

## 2023-06-06 ENCOUNTER — Ambulatory Visit: Payer: No Typology Code available for payment source | Admitting: "Endocrinology

## 2023-08-08 ENCOUNTER — Ambulatory Visit (HOSPITAL_COMMUNITY): Admission: RE | Admit: 2023-08-08 | Payer: No Typology Code available for payment source | Source: Ambulatory Visit

## 2023-08-12 ENCOUNTER — Institutional Professional Consult (permissible substitution): Payer: No Typology Code available for payment source | Admitting: Adult Health

## 2023-08-12 ENCOUNTER — Encounter: Payer: Self-pay | Admitting: Adult Health

## 2023-08-28 ENCOUNTER — Ambulatory Visit: Payer: No Typology Code available for payment source | Attending: Student | Admitting: Student

## 2023-08-28 ENCOUNTER — Encounter: Payer: Self-pay | Admitting: Student

## 2023-08-28 VITALS — BP 136/82 | HR 96 | Ht 67.0 in | Wt 301.4 lb

## 2023-08-28 DIAGNOSIS — I10 Essential (primary) hypertension: Secondary | ICD-10-CM

## 2023-08-28 DIAGNOSIS — I5022 Chronic systolic (congestive) heart failure: Secondary | ICD-10-CM

## 2023-08-28 DIAGNOSIS — E782 Mixed hyperlipidemia: Secondary | ICD-10-CM

## 2023-08-28 DIAGNOSIS — G4730 Sleep apnea, unspecified: Secondary | ICD-10-CM

## 2023-08-28 DIAGNOSIS — I483 Typical atrial flutter: Secondary | ICD-10-CM

## 2023-08-28 NOTE — Patient Instructions (Signed)
 Medication Instructions:  Your physician recommends that you continue on your current medications as directed. Please refer to the Current Medication list given to you today.  *If you need a refill on your cardiac medications before your next appointment, please call your pharmacy*   Lab Work: None If you have labs (blood work) drawn today and your tests are completely normal, you will receive your results only by: MyChart Message (if you have MyChart) OR A paper copy in the mail If you have any lab test that is abnormal or we need to change your treatment, we will call you to review the results.   Testing/Procedures: Reschedule Echo   Follow-Up: At [facility], you and your health needs are our priority.  As part of our continuing mission to provide you with exceptional heart care, we have created designated Provider Care Teams.  These Care Teams include your primary Cardiologist (physician) and Advanced Practice Providers (APPs -  Physician Assistants and Nurse Practitioners) who all work together to provide you with the care you need, when you need it.  We recommend signing up for the patient portal called MyChart.  Sign up information is provided on this After Visit Summary.  MyChart is used to connect with patients for Virtual Visits (Telemedicine).  Patients are able to view lab/test results, encounter notes, upcoming appointments, etc.  Non-urgent messages can be sent to your provider as well.   To learn more about what you can do with MyChart, go to [email or web address].    Your next appointment:   4-5 month(s)  Provider:   You may see Alvan Carrier, MD or one of the following Advanced Practice Providers on your designated Care Team:   Laymon Qua, [place]    Other Instructions

## 2023-08-28 NOTE — Progress Notes (Signed)
 Cardiology Office Note    Date:  08/29/2023  ID:  [name], DOB [birth year], MRN [id number] Cardiologist: Alvan Carrier, MD    History of Present Illness:    Jonathan Sellers is a 53 y.o. male with past medical history of HTN, HLD, Type II DM, OSA and newly diagnosed atrial flutter with RVR (diagnosed in 03/2023 and underwent TEE/DCCV) who presents to the office today for 67-month follow-up.  He was examined by myself in 04/2023 following his recent hospitalization for atrial flutter with RVR. He reported overall feeling well at that time and denied any recent palpitations or respiratory issues. He did have sinus tachycardia and Lopressor  was increased from 25 mg twice daily to 50 mg twice daily and he was provided with a HR/BP log to return. Was recommended if he had recurrent atrial flutter in the future, would refer to EP for consideration of ablation. Echocardiogram during his admission had been read as showing his EF at 50% and a follow-up limited echocardiogram had been recommended for reassessment.  This was ordered but it does not appear it was obtained in the interim as he no showed for his visit in 07/2023.  In talking with the patient today, he reports overall doing well since his last office visit. He denies any recent chest pain or palpitations. No specific orthopnea, PND or lower extremity edema. Reports his weight is significantly less at home and he has lost 11 pounds in the past few weeks due to dietary changes. He does have scheduled follow-up with Pulmonology in the coming months for further management of his sleep apnea as he has been without his CPAP mask. Heart rate was initially elevated during today's visit but rechecked and improved. He reports having to walk a long distance to the office as he parked in the main parking lot of the hospital.  Studies Reviewed:   EKG: EKG is ordered today and demonstrates:   EKG Interpretation Date/Time:  Wednesday August 28 2023 15:12:26  EST Ventricular Rate:  118 PR Interval:  138 QRS Duration:  80 QT Interval:  324 QTC Calculation: 454 R Axis:   49  Text Interpretation: Sinus tachycardia Confirmed by Johnson Grate ([id number]) on 08/28/2023 3:21:35 PM       TEE: 03/2023 IMPRESSIONS     1. Difficult assessment of LVEF by TEE in setting of atrial flutter with  elevated rates. Grossly LVEF 50%, would recommend TTE over time in sinus  rhythm to clarify function. . Left ventricular ejection fraction, by  estimation, is 50%. The left ventricle   has low normal function.   2. Right ventricular systolic function is normal. The right ventricular  size is normal.   3. Left atrial size was mildly dilated. No left atrial/left atrial  appendage thrombus was detected. The LAA emptying velocity was 83 cm/s.   4. Right atrial size was mildly dilated.   5. The mitral valve is abnormal. Mild mitral valve regurgitation. No  evidence of mitral stenosis.   6. The tricuspid valve is abnormal. Tricuspid valve regurgitation is  moderate.   7. The aortic valve is tricuspid. Aortic valve regurgitation is not  visualized. No aortic stenosis is present.   8. Agitated saline contrast bubble study was negative, with no evidence  of any interatrial shunt.    Risk Assessment/Calculations:    CHA2DS2-VASc Score = 2   This indicates a 2.2% annual risk of stroke. The patient's score is based upon: CHF History: 0 HTN History: 1 Diabetes  History: 1 Stroke History: 0 Vascular Disease History: 0 Age Score: 0 Gender Score: 0   Physical Exam:   VS:  BP 136/82   Pulse 96   Ht 5' 7 (1.702 m)   Wt (!) 301 lb 6.4 oz (136.7 kg)   SpO2 96%   BMI 47.21 kg/m    Wt Readings from Last 3 Encounters:  08/28/23 (!) 301 lb 6.4 oz (136.7 kg)  05/16/23 294 lb (133.4 kg)  05/16/23 294 lb 9.6 oz (133.6 kg)     GEN: Well nourished, well developed male appearing in no acute distress NECK: No JVD; No carotid bruits CARDIAC: RRR, no murmurs,  rubs, gallops RESPIRATORY:  Clear to auscultation without rales, wheezing or rhonchi  ABDOMEN: Appears non-distended. No obvious abdominal masses. EXTREMITIES: No clubbing or cyanosis. No pitting edema.  Distal pedal pulses are 2+ bilaterally.   Assessment and Plan:   1. Atrial Flutter - This was diagnosed in 03/2023 and he underwent successful TEE/DCCV during admission. He was tachycardic initially at 118 bpm during today's visit but improved to 96 bpm on recheck. EKG did confirm sinus tachycardia. Continue Lopressor  50 mg twice daily. - No reports of active bleeding. Remains on Eliquis  5 mg twice daily for anticoagulation which is the appropriate dose at this time given his age, weight and renal function.  2. Chronic HFmrEF - His ejection fraction was previously at 50% in 03/2023. An echocardiogram was previously recommended but he missed the appointment. Will have this rescheduled. - He does appear euvolemic by examination today. Remains on Torsemide  20 mg daily, Lisinopril  10 mg daily and Lopressor  50 mg twice daily. He is also on Jardiance  but 25 mg daily per Endocrinology.  3. HTN - Blood pressure was initially elevated, rechecked and improved to 136/82. He is currently on Lisinopril  10 mg daily and Lopressor  50 mg twice daily. I encouraged him to follow this at home and report back if BP remains elevated as Lisinopril  or Lopressor  could be further titrated.  4. HLD - Followed by his PCP. Remains on Atorvastatin  40 mg daily.  5. OSA - He was previously referred to [facility] for consideration of a sleep study but canceled the visit in 07/2023. He does have upcoming follow-up in 10/2023 and was encouraged to keep this.  Signed, Jonathan CHRISTELLA Qua, PA-C

## 2023-08-29 ENCOUNTER — Encounter: Payer: Self-pay | Admitting: Student

## 2023-09-06 ENCOUNTER — Ambulatory Visit (HOSPITAL_COMMUNITY): Admission: RE | Admit: 2023-09-06 | Payer: No Typology Code available for payment source | Source: Ambulatory Visit

## 2023-09-24 ENCOUNTER — Ambulatory Visit (HOSPITAL_COMMUNITY)
Admission: RE | Admit: 2023-09-24 | Discharge: 2023-09-24 | Disposition: A | Discharge status: Home / Self Care | Payer: No Typology Code available for payment source | Source: Ambulatory Visit | Attending: Student | Admitting: Student

## 2023-09-24 DIAGNOSIS — I5031 Acute diastolic (congestive) heart failure: Secondary | ICD-10-CM | POA: Diagnosis not present

## 2023-09-24 DIAGNOSIS — I483 Typical atrial flutter: Secondary | ICD-10-CM | POA: Insufficient documentation

## 2023-09-24 LAB — ECHOCARDIOGRAM LIMITED: S' Lateral: 3.3 cm

## 2023-09-24 NOTE — Progress Notes (Signed)
*  PRELIMINARY RESULTS* Echocardiogram Limited 2-D Echocardiogram  has been performed.  [name] 09/24/2023, 4:07 PM

## 2023-11-04 ENCOUNTER — Institutional Professional Consult (permissible substitution): Payer: No Typology Code available for payment source | Admitting: Adult Health

## 2024-01-07 ENCOUNTER — Encounter: Payer: Self-pay | Admitting: Cardiology

## 2024-01-07 ENCOUNTER — Other Ambulatory Visit (HOSPITAL_COMMUNITY)
Admission: RE | Admit: 2024-01-07 | Discharge: 2024-01-07 | Disposition: A | Discharge status: Home / Self Care | Source: Ambulatory Visit | Attending: Cardiology | Admitting: Cardiology

## 2024-01-07 ENCOUNTER — Ambulatory Visit: Payer: No Typology Code available for payment source | Attending: Cardiology | Admitting: Cardiology

## 2024-01-07 VITALS — BP 134/85 | HR 123 | Ht 67.0 in | Wt 271.6 lb

## 2024-01-07 DIAGNOSIS — E782 Mixed hyperlipidemia: Secondary | ICD-10-CM

## 2024-01-07 DIAGNOSIS — I483 Typical atrial flutter: Secondary | ICD-10-CM

## 2024-01-07 DIAGNOSIS — R000 Tachycardia, unspecified: Secondary | ICD-10-CM | POA: Insufficient documentation

## 2024-01-07 DIAGNOSIS — I10 Essential (primary) hypertension: Secondary | ICD-10-CM

## 2024-01-07 LAB — CBC
HCT: 46 % (ref 39.0–52.0)
Hemoglobin: 16.1 g/dL (ref 13.0–17.0)
MCH: 31.7 pg (ref 26.0–34.0)
MCHC: 35 g/dL (ref 30.0–36.0)
MCV: 90.6 fL (ref 80.0–100.0)
Platelets: 259 10*3/uL (ref 150–400)
RBC: 5.08 MIL/uL (ref 4.22–5.81)
RDW: 14.1 % (ref 11.5–15.5)
WBC: 5.4 10*3/uL (ref 4.0–10.5)
nRBC: 0.4 % — ABNORMAL HIGH (ref 0.0–0.2)

## 2024-01-07 LAB — TSH: TSH: 1.134 u[IU]/mL (ref 0.350–4.500)

## 2024-01-07 LAB — BASIC METABOLIC PANEL WITH GFR
Anion gap: 13 (ref 5–15)
BUN: 32 mg/dL — ABNORMAL HIGH (ref 6–20)
CO2: 21 mmol/L — ABNORMAL LOW (ref 22–32)
Calcium: 10.6 mg/dL — ABNORMAL HIGH (ref 8.9–10.3)
Chloride: 92 mmol/L — ABNORMAL LOW (ref 98–111)
Creatinine, Ser: 0.86 mg/dL (ref 0.61–1.24)
GFR, Estimated: >60 mL/min (ref >60)
Glucose, Bld: 447 mg/dL — ABNORMAL HIGH (ref 70–99)
Potassium: 3.5 mmol/L (ref 3.5–5.1)
Sodium: 126 mmol/L — ABNORMAL LOW (ref 135–145)

## 2024-01-07 LAB — T4, FREE: Free T4: 0.84 ng/dL (ref 0.61–1.12)

## 2024-01-07 NOTE — Patient Instructions (Signed)
 Medication Instructions:  Your physician recommends that you continue on your current medications as directed. Please refer to the Current Medication list given to you today.  *If you need a refill on your cardiac medications before your next appointment, please call your pharmacy*  Lab Work: TSH T3 T4, Free CBC BMET  If you have labs (blood work) drawn today and your tests are completely normal, you will receive your results only by: MyChart Message (if you have MyChart) OR A paper copy in the mail If you have any lab test that is abnormal or we need to change your treatment, we will call you to review the results.  Testing/Procedures: None  Follow-Up: At [facility], you and your health needs are our priority.  As part of our continuing mission to provide you with exceptional heart care, our providers are all part of one team.  This team includes your primary Cardiologist (physician) and Advanced Practice Providers or APPs (Physician Assistants and Nurse Practitioners) who all work together to provide you with the care you need, when you need it.  Your next appointment:   6 month(s)  Provider:   You may see Armida Lander, MD or one of the following Advanced Practice Providers on your designated Care Team:   Woodfin Hays, PA-C  [address], [place] Theotis Flake, [place]     We recommend signing up for the patient portal called "MyChart".  Sign up information is provided on this After Visit Summary.  MyChart is used to connect with patients for Virtual Visits (Telemedicine).  Patients are able to view lab/test results, encounter notes, upcoming appointments, etc.  Non-urgent messages can be sent to your provider as well.   To learn more about what you can do with MyChart, go to [email or web address].   Other Instructions

## 2024-01-07 NOTE — Progress Notes (Signed)
 Clinical Summary Jonathan Sellers is a 53 y.o.male seen today for follow up of the following medical problems.  1.Aflutter - new diagnosis during 03/2023 admission, presented with aflutter with RVR - 04/16/23 had successful TEE/DCCV to sinus rhythm - has chronic sinus tachycardia at least since 12/2022  - no recent palpitations - compliant with meds - no bleeding on eliquis .   2. HTN - compliant with meds  3. HLD - labs followed by pcp   4. OSA - does not have cpap - not interested in seeing pulm not yet.   5. Low TSH - 03/2023 TSH was low, normal free T4  Past Medical History:  Diagnosis Date   Diabetes (HCC)    HLD (hyperlipidemia)    HTN (hypertension)    Sleep apnea      No Known Allergies   Current Outpatient Medications  Medication Sig Dispense Refill   apixaban  (ELIQUIS ) 5 MG TABS tablet Take 1 tablet (5 mg total) by mouth 2 (two) times daily. 180 tablet 3   atorvastatin  (LIPITOR) 40 MG tablet Take 40 mg by mouth daily.     Continuous Glucose Receiver (DEXCOM G7 RECEIVER) DEVI Use as instructed. 1 each 0   Continuous Glucose Receiver (FREESTYLE LIBRE 3 READER) DEVI 1 each by Other route as directed. Use to check glucose as directed. 1 each 0   Continuous Glucose Sensor (FREESTYLE LIBRE 3 SENSOR) MISC Use to check glucose continuously as directed. 2 each 2   empagliflozin  (JARDIANCE ) 25 MG TABS tablet Take 1 tablet (25 mg total) by mouth daily before breakfast. 90 tablet 1   LANTUS  SOLOSTAR 100 UNIT/ML Solostar Pen Inject 80 Units into the skin at bedtime.     lisinopril  (ZESTRIL ) 10 MG tablet Take 1 tablet (10 mg total) by mouth daily. 90 tablet 3   metFORMIN  (GLUCOPHAGE ) 500 MG tablet Take 500 mg by mouth 2 (two) times daily.     metoprolol  tartrate (LOPRESSOR ) 50 MG tablet Take 1 tablet (50 mg total) by mouth 2 (two) times daily. 180 tablet 3   Potassium 99 MG TABS Take 1 tablet by mouth daily.     torsemide  (DEMADEX ) 20 MG tablet Take 20 mg by mouth daily.      No current facility-administered medications for this visit.     Past Surgical History:  Procedure Laterality Date   CARDIOVERSION N/A 04/16/2023   Procedure: CARDIOVERSION;  Surgeon: Laurann Pollock, MD;  Location: [facility] ORS;  Service: Endoscopy;  Laterality: N/A;   COLONOSCOPY WITH PROPOFOL  N/A 07/12/2020   Procedure: COLONOSCOPY WITH PROPOFOL ;  Surgeon: Vinetta Greening, DO;  Location: [facility] ENDO SUITE;  Service: Endoscopy;  Laterality: N/A;  8:00am   POLYPECTOMY  07/12/2020   Procedure: POLYPECTOMY;  Surgeon: Vinetta Greening, DO;  Location: [facility] END[facility] SUITE;  Service: Endoscopy;;   WISDOM TOOTH EXTRACTION       No Known Allergies    Family History  Problem Relation Age of Onset   Heart failure Mother    COPD Sister    Colon cancer Neg Hx      Social History Mr. Stenseth reports that he has never smoked. He has never used smokeless tobacco. Mr. Kussman reports no history of alcohol use.    Physical Examination Today's Vitals   01/07/24 1407 01/07/24 1433  BP: (!) 150/98 134/85  Pulse: (!) 123   SpO2: 95%   Weight: 271 lb 9.6 oz (123.2 kg)   Height: 5\' 7"  (1.702 m)  Body mass index is 42.54 kg/m.  Gen: resting comfortably, no acute distress HEENT: no scleral icterus, pupils equal round and reactive, no palptable cervical adenopathy,  CV: Regular, tachy, no m/rg , no jvd Resp: Clear to auscultation bilaterally GI: abdomen is soft, non-tender, non-distended, normal bowel sounds, no hepatosplenomegaly MSK: extremities are warm, no edema.  Skin: warm, no rash Neuro:  no focal deficits Psych: appropriate affect   Diagnostic Studies 09/2023 echo 1. Left ventricular ejection fraction, by estimation, is 60 to 65%. The  left ventricle has normal function. The left ventricle has no regional  wall motion abnormalities. There is moderate left ventricular hypertrophy.   2. Right ventricular systolic function is normal. The right ventricular  size is normal.   3. The  inferior vena cava is normal in size with greater than 50%  respiratory variability, suggesting right atrial pressure of 3 mmHg.   4. Limited echo evaluate LV function     Assessment and Plan  1.Aflutter - no evidence of recurrecen since 03/2023 DCCV -has had sinus tach at least since 12/2022 based on EKGs, continues today. Recheck TSH which was a little low last year, check cbc and bmet - continue currnent meds incliuding eliquis  for stroke prevention  2. HTN - essentially at goal. He has lost 30 lbs since January working on more weight loss, I think bp's will continue to improve.  3. HLD - request pcp labs    F/u 6 months  Laurann Pollock, M.D.

## 2024-01-08 LAB — T3: T3, Total: 114 ng/dL (ref 71–180)

## 2024-02-11 ENCOUNTER — Ambulatory Visit: Payer: Self-pay | Admitting: Cardiology
